# Patient Record
Sex: Male | Born: 1960 | Race: Black or African American | Hispanic: No | Marital: Married | Smoking: Never smoker
Health system: Southern US, Community
[De-identification: ages and names within clinical notes are randomized; demographics above are authoritative.]

## PROBLEM LIST (undated history)

## (undated) DIAGNOSIS — M25562 Pain in left knee: Secondary | ICD-10-CM

## (undated) DIAGNOSIS — M1990 Unspecified osteoarthritis, unspecified site: Secondary | ICD-10-CM

## (undated) DIAGNOSIS — R001 Bradycardia, unspecified: Secondary | ICD-10-CM

## (undated) DIAGNOSIS — M25561 Pain in right knee: Secondary | ICD-10-CM

## (undated) HISTORY — PX: REPLACEMENT TOTAL KNEE: SUR1224

## (undated) HISTORY — PX: KNEE ARTHROSCOPY: SUR90

---

## 1998-10-28 ENCOUNTER — Encounter: Admission: RE | Admit: 1998-10-28 | Discharge: 1998-10-28 | Payer: Self-pay | Admitting: Hematology and Oncology

## 1999-12-28 ENCOUNTER — Emergency Department (HOSPITAL_COMMUNITY): Admission: EM | Admit: 1999-12-28 | Discharge: 1999-12-28 | Payer: Self-pay | Admitting: Emergency Medicine

## 2000-02-17 ENCOUNTER — Emergency Department (HOSPITAL_COMMUNITY): Admission: EM | Admit: 2000-02-17 | Discharge: 2000-02-17 | Payer: Self-pay | Admitting: Emergency Medicine

## 2000-05-08 ENCOUNTER — Emergency Department (HOSPITAL_COMMUNITY): Admission: EM | Admit: 2000-05-08 | Discharge: 2000-05-08 | Payer: Self-pay | Admitting: Emergency Medicine

## 2000-05-08 ENCOUNTER — Encounter: Payer: Self-pay | Admitting: Emergency Medicine

## 2012-11-23 ENCOUNTER — Encounter (HOSPITAL_BASED_OUTPATIENT_CLINIC_OR_DEPARTMENT_OTHER): Payer: Self-pay | Admitting: *Deleted

## 2012-11-23 ENCOUNTER — Emergency Department (HOSPITAL_BASED_OUTPATIENT_CLINIC_OR_DEPARTMENT_OTHER): Payer: BC Managed Care – PPO

## 2012-11-23 ENCOUNTER — Emergency Department (HOSPITAL_BASED_OUTPATIENT_CLINIC_OR_DEPARTMENT_OTHER)
Admission: EM | Admit: 2012-11-23 | Discharge: 2012-11-23 | Disposition: A | Discharge status: Home / Self Care | Payer: BC Managed Care – PPO | Attending: Family Medicine | Admitting: Family Medicine

## 2012-11-23 DIAGNOSIS — J209 Acute bronchitis, unspecified: Secondary | ICD-10-CM | POA: Insufficient documentation

## 2012-11-23 DIAGNOSIS — J40 Bronchitis, not specified as acute or chronic: Secondary | ICD-10-CM

## 2012-11-23 MED ORDER — HYDROCOD POLST-CHLORPHEN POLST 10-8 MG/5ML PO LQCR: 5.0000 mL | Freq: Two times a day (BID) | ORAL | Status: DC

## 2012-11-23 MED ORDER — AZITHROMYCIN 250 MG PO TABS: 250.0000 mg | ORAL_TABLET | Freq: Every day | ORAL | Status: DC

## 2012-11-23 NOTE — ED Provider Notes (Signed)
History     CSN: [id number]  Arrival date & time 11/23/12  1013   First MD Initiated Contact with Patient 11/23/12 1215      Chief Complaint  Patient presents with  . Cough    (Consider location/radiation/quality/duration/timing/severity/associated sxs/prior treatment) Patient is a 52 y.o. male presenting with cough. The history is provided by the patient. No language interpreter was used.  Cough This is a new problem. The current episode started more than 1 week ago. The problem occurs constantly. The problem has been gradually worsening. The cough is productive of sputum. The maximum temperature recorded prior to his arrival was 101 to 101.9 F. Pertinent negatives include no shortness of breath. He has tried nothing for the symptoms. The treatment provided no relief. He is not a smoker.  Pt complains of a cough and congestion  History reviewed. No pertinent past medical history.  History reviewed. No pertinent past surgical history.  No family history on file.  History  Substance Use Topics  . Smoking status: Never Smoker   . Smokeless tobacco: Not on file  . Alcohol Use: No      Review of Systems  Respiratory: Positive for cough. Negative for shortness of breath.   All other systems reviewed and are negative.    Allergies  Review of patient's allergies indicates no known allergies.  Home Medications  No current outpatient prescriptions on file.  BP 131/89  Pulse 67  Temp 98 F (36.7 C) (Oral)  Resp 20  Ht 6\' 1"  (1.854 m)  Wt 236 lb (107.049 kg)  BMI 31.14 kg/m2  SpO2 97%  Physical Exam  Nursing note and vitals reviewed. Constitutional: He is oriented to person, place, and time. He appears well-developed and well-nourished.  HENT:  Head: Normocephalic and atraumatic.  Eyes: Conjunctivae normal are normal. Pupils are equal, round, and reactive to light.  Neck: Normal range of motion. Thyromegaly present.  Cardiovascular: Normal rate and normal heart  sounds.   Pulmonary/Chest: Effort normal and breath sounds normal.  Abdominal: Soft.  Musculoskeletal: Normal range of motion.  Neurological: He is alert and oriented to person, place, and time.  Skin: Skin is warm.    ED Course  Procedures (including critical care time)  Labs Reviewed - No data to display Dg Chest 2 View  11/23/2012  *RADIOLOGY REPORT*  Clinical Data: Cough and congestion.  Fevers and body aches.  CHEST - 2 VIEW  Comparison: None available.  Findings: The heart size is normal.  The lung volumes are slightly low.  No focal airspace disease is evident.  The visualized soft tissues and bony thorax are unremarkable.  IMPRESSION:  1.  Low lung volumes. 2.  No acute cardiopulmonary disease.   Original Report Authenticated By: Marin Roberts, M.D.      No diagnosis found.    MDM  Zithromax and tussionex          Lonia Skinner [place], [place] 11/23/12 1230

## 2012-11-23 NOTE — ED Notes (Signed)
Cough, chest congestion and weakness. States he has been sick for over 2 weeks. He started feeling better then 2 days ago started feeling bad again. Drove himself here. Droplet protection. Wearing a mask at triage.

## 2012-11-27 NOTE — ED Provider Notes (Signed)
Medical screening examination/treatment/procedure(s) were performed by resident physician or non-physician practitioner and as supervising physician I was immediately available for consultation/collaboration.   Barkley Bruns MD.    Linna Hoff, MD 11/27/12 [phone]

## 2014-03-19 IMAGING — CR DG CHEST 2V
2 series · 2 of 2 positions shown · non-contrast
Comparison: None available.

CLINICAL DATA: Cough and congestion.  Fevers and body aches.

CHEST - 2 VIEW

[w chest pa]
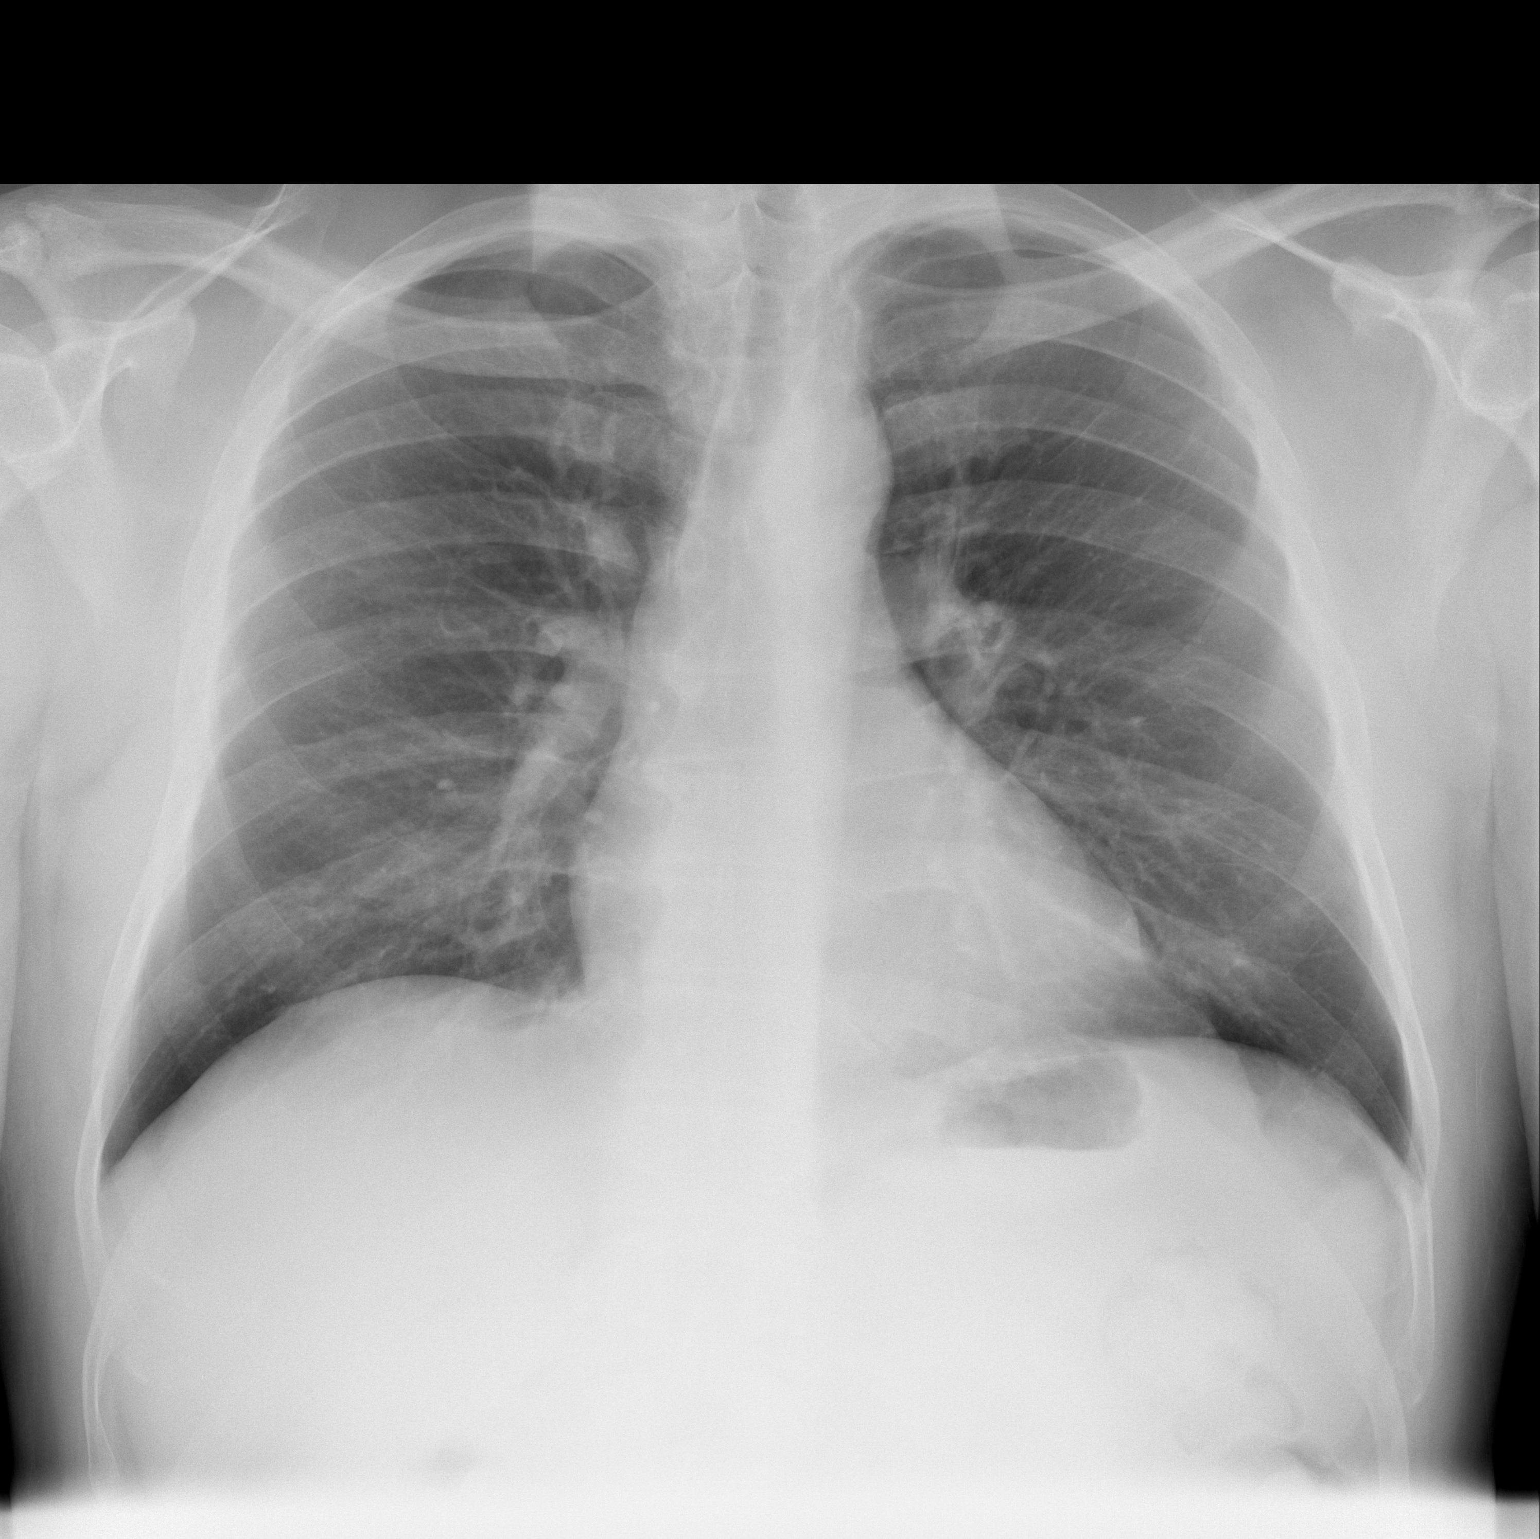

[w chest lat]
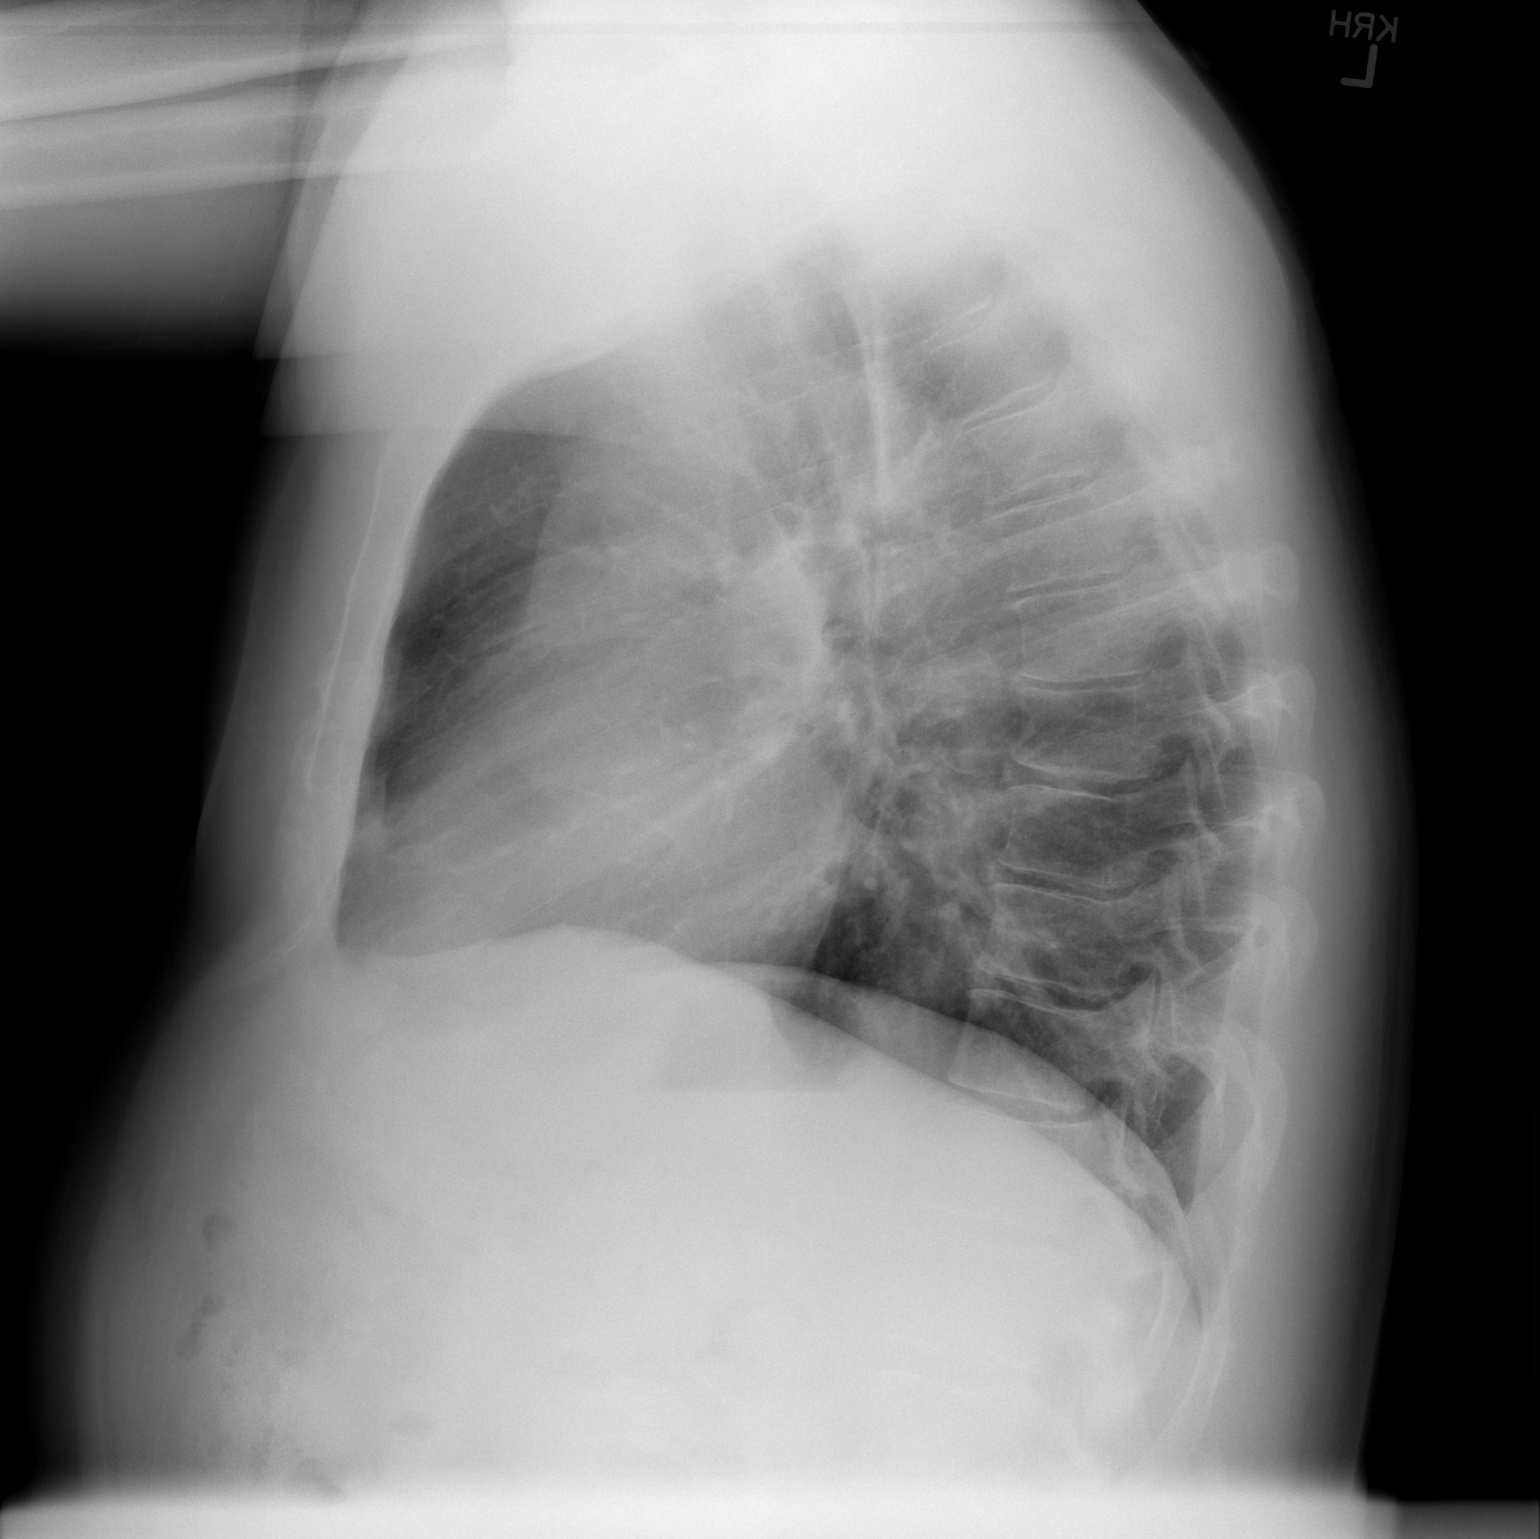

[2 of 2 positions shown; findings below may reference images not displayed]

FINDINGS: The heart size is normal.  The lung volumes are slightly
low.  No focal airspace disease is evident.  The visualized soft
tissues and bony thorax are unremarkable.
IMPRESSION: 1.  Low lung volumes.
2.  No acute cardiopulmonary disease.

## 2015-07-03 ENCOUNTER — Other Ambulatory Visit: Payer: Self-pay | Admitting: Gastroenterology

## 2015-09-24 ENCOUNTER — Encounter (HOSPITAL_COMMUNITY): Payer: Self-pay | Admitting: *Deleted

## 2015-09-30 ENCOUNTER — Encounter (HOSPITAL_COMMUNITY)
Admission: RE | Disposition: A | Discharge status: Home / Self Care | Payer: Self-pay | Source: Ambulatory Visit | Attending: Gastroenterology

## 2015-09-30 ENCOUNTER — Ambulatory Visit (HOSPITAL_COMMUNITY): Payer: BLUE CROSS/BLUE SHIELD | Admitting: Anesthesiology

## 2015-09-30 ENCOUNTER — Encounter (HOSPITAL_COMMUNITY): Payer: Self-pay

## 2015-09-30 ENCOUNTER — Ambulatory Visit (HOSPITAL_COMMUNITY)
Admission: RE | Admit: 2015-09-30 | Discharge: 2015-09-30 | Disposition: A | Discharge status: Home / Self Care | Payer: BLUE CROSS/BLUE SHIELD | Source: Ambulatory Visit | Attending: Gastroenterology | Admitting: Gastroenterology

## 2015-09-30 DIAGNOSIS — D120 Benign neoplasm of cecum: Secondary | ICD-10-CM | POA: Diagnosis not present

## 2015-09-30 DIAGNOSIS — Z1211 Encounter for screening for malignant neoplasm of colon: Secondary | ICD-10-CM | POA: Insufficient documentation

## 2015-09-30 DIAGNOSIS — Z6831 Body mass index (BMI) 31.0-31.9, adult: Secondary | ICD-10-CM | POA: Insufficient documentation

## 2015-09-30 DIAGNOSIS — E669 Obesity, unspecified: Secondary | ICD-10-CM | POA: Diagnosis not present

## 2015-09-30 HISTORY — DX: Pain in right knee: M25.561

## 2015-09-30 HISTORY — PX: COLONOSCOPY WITH PROPOFOL: SHX5780

## 2015-09-30 HISTORY — DX: Pain in left knee: M25.562

## 2015-09-30 SURGERY — COLONOSCOPY WITH PROPOFOL
Anesthesia: Monitor Anesthesia Care

## 2015-09-30 MED ORDER — PROPOFOL 500 MG/50ML IV EMUL
INTRAVENOUS | Status: DC | PRN
  Administered 2015-09-30: 100 ug/kg/min via INTRAVENOUS

## 2015-09-30 MED ORDER — PROPOFOL 10 MG/ML IV BOLUS
INTRAVENOUS | Status: AC
  Filled 2015-09-30: qty 40

## 2015-09-30 MED ORDER — SODIUM CHLORIDE 0.9 % IV SOLN: INTRAVENOUS | Status: DC

## 2015-09-30 MED ORDER — LIDOCAINE HCL (CARDIAC) 20 MG/ML IV SOLN
INTRAVENOUS | Status: DC | PRN
  Administered 2015-09-30: 50 mg via INTRAVENOUS

## 2015-09-30 MED ORDER — LACTATED RINGERS IV SOLN
INTRAVENOUS | Status: DC
  Administered 2015-09-30: 09:00:00 via INTRAVENOUS

## 2015-09-30 MED ORDER — PROPOFOL 500 MG/50ML IV EMUL
INTRAVENOUS | Status: DC | PRN
  Administered 2015-09-30 (×2): 30 mg via INTRAVENOUS

## 2015-09-30 SURGICAL SUPPLY — 21 items

## 2015-09-30 NOTE — H&P (Signed)
  Procedure: Baseline screening colonoscopy. No family history of colon cancer  History: The patient is a 54 year old male born 1961-09-23. He is scheduled to undergo his first screening colonoscopy with polypectomy to prevent colon cancer.  Past medical history: No chronic medical problems  Exam: The patient is alert and lying comfortably on the endoscopy stretcher. Abdomen is soft and nontender to palpation. Lungs are clear to auscultation. Cardiac exam reveals a regular rhythm.  Plan: Proceed with baseline screening colonoscopy.

## 2015-09-30 NOTE — Transfer of Care (Signed)
Immediate Anesthesia Transfer of Care Note  Patient: Javier Lawson  Procedure(s) Performed: Procedure(s): COLONOSCOPY WITH PROPOFOL (N/A)  Patient Location: PACU  Anesthesia Type:MAC  Level of Consciousness: sedated, patient cooperative and responds to stimulation  Airway & Oxygen Therapy: Patient Spontanous Breathing and Patient connected to face mask oxygen  Post-op Assessment: Report given to RN and Post -op Vital signs reviewed and stable  Post vital signs: Reviewed and stable  Last Vitals:  Filed Vitals:   09/30/15 0800 09/30/15 0912  BP: 132/71 112/68  Pulse: 9 64  Temp: 36.8 C   Resp: 16 12    Complications: No apparent anesthesia complications

## 2015-09-30 NOTE — Op Note (Signed)
Procedure: Baseline screening colonoscopy. No family history of colon cancer.  Endoscopist: Earle Gell  Premedication: Propofol administered by anesthesia  Procedure: The patient was placed in the left lateral decubitus position. Anal inspection and digital rectal exam were normal. The Pentax pediatric colonoscope was introduced into the rectum and advanced to the cecum. A normal-appearing appendiceal orifice and ileocecal valve were identified. Colonic preparation for the exam today was good. Withdrawal time was 11 minutes  Rectum. Normal. Retroflexed view of the distal rectum was normal  Sigmoid colon and descending colon. Normal  Splenic flexure. Normal  Transverse colon. Normal  Hepatic flexure. Normal  Ascending colon. Normal  Cecum and ileocecal valve. A 3 mm sessile polyp was removed from the mid cecum with the cold biopsy forceps  Assessment: A diminutive polyp was removed from the cecum. Otherwise normal colonoscopy.  Recommendation: If the polyp returns adenomatous pathologically, schedule repeat colonoscopy in 5 years

## 2015-09-30 NOTE — Anesthesia Preprocedure Evaluation (Signed)
Anesthesia Evaluation  Patient identified by MRN, date of birth, ID band Patient awake    Reviewed: Allergy & Precautions, NPO status , Patient's Chart, lab work & pertinent test results  Airway Mallampati: I  TM Distance: >3 FB Neck ROM: Full    Dental  (+) Teeth Intact, Dental Advisory Given   Pulmonary neg pulmonary ROS,    Pulmonary exam normal breath sounds clear to auscultation       Cardiovascular Exercise Tolerance: Good (-) hypertension(-) angina(-) CAD and (-) Past MI negative cardio ROS Normal cardiovascular exam Rhythm:Regular Rate:Normal     Neuro/Psych negative neurological ROS  negative psych ROS   GI/Hepatic negative GI ROS, Neg liver ROS,   Endo/Other  Obesity   Renal/GU negative Renal ROS  negative genitourinary   Musculoskeletal negative musculoskeletal ROS (+)   Abdominal   Peds  Hematology negative hematology ROS (+)   Anesthesia Other Findings Day of surgery medications reviewed with the patient.  Reproductive/Obstetrics                             Anesthesia Physical Anesthesia Plan  ASA: II  Anesthesia Plan: MAC   Post-op Pain Management:    Induction: Intravenous  Airway Management Planned: Nasal Cannula  Additional Equipment:   Intra-op Plan:   Post-operative Plan:   Informed Consent: I have reviewed the patients History and Physical, chart, labs and discussed the procedure including the risks, benefits and alternatives for the proposed anesthesia with the patient or authorized representative who has indicated his/her understanding and acceptance.   Dental advisory given  Plan Discussed with: CRNA and Anesthesiologist  Anesthesia Plan Comments: (Discussed risks/benefits/alternatives to MAC sedation including need for ventilatory support, hypotension, need for conversion to general anesthesia.  All patient questions answered.  Patient wished to  proceed.)        Anesthesia Quick Evaluation

## 2015-09-30 NOTE — Anesthesia Postprocedure Evaluation (Signed)
Anesthesia Post Note  Patient: Javier Lawson  Procedure(s) Performed: Procedure(s) (LRB): COLONOSCOPY WITH PROPOFOL (N/A)  Patient location during evaluation: PACU Anesthesia Type: MAC Level of consciousness: awake and alert Pain management: pain level controlled Vital Signs Assessment: post-procedure vital signs reviewed and stable Respiratory status: spontaneous breathing, nonlabored ventilation, respiratory function stable and patient connected to nasal cannula oxygen Cardiovascular status: stable and blood pressure returned to baseline Anesthetic complications: no    Last Vitals:  Filed Vitals:   09/30/15 0920 09/30/15 0930  BP: 112/74 111/66  Pulse: 51 42  Temp:    Resp: 19 14    Last Pain: There were no vitals filed for this visit.               Catalina Gravel

## 2015-09-30 NOTE — Discharge Instructions (Signed)

## 2015-10-01 ENCOUNTER — Encounter (HOSPITAL_COMMUNITY): Payer: Self-pay | Admitting: Gastroenterology

## 2018-07-05 ENCOUNTER — Encounter (INDEPENDENT_AMBULATORY_CARE_PROVIDER_SITE_OTHER): Payer: Self-pay | Admitting: Orthopaedic Surgery

## 2018-07-05 ENCOUNTER — Telehealth (INDEPENDENT_AMBULATORY_CARE_PROVIDER_SITE_OTHER): Payer: Self-pay

## 2018-07-05 ENCOUNTER — Ambulatory Visit (INDEPENDENT_AMBULATORY_CARE_PROVIDER_SITE_OTHER): Payer: No Typology Code available for payment source | Admitting: Orthopaedic Surgery

## 2018-07-05 VITALS — BP 124/79 | HR 56 | Ht 73.0 in | Wt 238.0 lb

## 2018-07-05 DIAGNOSIS — M5126 Other intervertebral disc displacement, lumbar region: Secondary | ICD-10-CM

## 2018-07-05 NOTE — Progress Notes (Signed)
Office Visit Note for IME   Patient: Javier Lawson           Date of Birth: 01-08-1961           MRN: [id number] Visit Date: 07/05/2018              Requested by: No referring provider defined for this encounter. PCP: Cathie Olden, MD   Assessment & Plan: Visit Diagnoses:  1. Protrusion of lumbar intervertebral disc           L5-S1 with last MRI improved.   Plan: Patient's had appropriate care and treatment.  We reviewed MRI images from his February MRI which she had not seen.  Earlier 01/07/2017 MRI was also reviewed.  Patient has absent ankle jerk on the left and some mild nerve root tension signs but no isolated motor weakness and no significant nerve compression by MRI scan.  He is doing regular work with some symptoms.  He has had appropriate rating.  If he had some significant increase in symptoms at some point single epidural injection would be an option.  Patient has questions about disc degeneration and pathophysiology which we discussed.  No further imaging or treatment is recommended at this point.  Follow-Up Instructions: Return if symptoms worsen or fail to improve.   Orders:  No orders of the defined types were placed in this encounter.  No orders of the defined types were placed in this encounter.     Procedures: No procedures performed   Clinical Data: No additional findings.   Subjective: Chief Complaint  Patient presents with  . Lower Back - Pain    IME    HPI 57 year old male seen for an IME concerning 2 separate on-the-job injuries.  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  He was injured in February 2018 moving rolls of cough another employee was helping him pick it up he turned and twisted had sharp pain in his back and into his left buttocks.  He was treated conservatively by Dr. Gladstone Lighter with medication, outpatient physical therapy.  MRI showed disc herniation L5-S1 on the left and surgery  was discussed at that point.  Patient took a Medrol Dosepak and got some improvement after the Dosepak and surgery was not performed.  July was rated 5% return to full duty.  He reinjured his back 10/22/2017 and states he was driving a forklift for plate broke in the forklift when in the hole more than the foot deep.  He had recurrence of back pain left leg pain.  He denies any right leg symptoms.  He had no bowel or bladder symptoms.  A follow-up MRI was obtained which showed resolution of the disc herniation at L5-S1.  He was on modified duty for period of time and eventually resume normal activity and regular work without restrictions.  He was rated at 10%.  Patient still has had some intermittent pain in his leg some difficulty with forward flexion as he bends down to touch his toes.  He states his pain is around 3 out of 10 and is variable with some days worse than others.  Patient requested IME since he was not symptomatic before his original injuries and had not had complete resolution of his symptoms.  MRI showed some mild narrowing of left lateral recess and both foramen at L5-S1 and some facet arthropathy slightly worse at L4 -5 than L5-S1.  Patient's had muscle relaxant prednisone pack lumbar brace, outpatient physical  therapy.  No epidural injections.  Accompanying patient is to MRI scans in approximately 40 pages of notes including urgent care, first and second injury MRI, scans, physical therapy, 25R   Review of Systems patient's a non-smoker.  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  14 point review of systems negative as it pertains HPI.   Objective: Vital Signs: BP 124/79   Pulse (!) 56   Ht 6\' 1"  (1.854 m)   Wt 238 lb (108 kg)   BMI 31.40 kg/m   Physical Exam  Constitutional: He is oriented to person, place, and time. He appears well-developed and well-nourished.  HENT:  Head:  Normocephalic and atraumatic.  Eyes: Pupils are equal, round, and reactive to light. EOM are normal.  Neck: No tracheal deviation present. No thyromegaly present.  Cardiovascular: Normal rate.  Pulmonary/Chest: Effort normal. He has no wheezes.  Abdominal: Soft. Bowel sounds are normal.  Neurological: He is alert and oriented to person, place, and time.  Skin: Skin is warm and dry. Capillary refill takes less than 2 seconds.  Psychiatric: He has a normal mood and affect. His behavior is normal. Judgment and thought content normal.    Ortho Exam patient gets from sitting to standing on off the exam table easily.  He can heel and toe walk.  Has some pain with straight leg raising on the left at 90 degrees some mild pain with sciatic notch palpation and palpation of the left SI joint none on the right.  Anterior tib EHL gastrocsoleus is normal.  Has 2+ knee jerk 2+ right ankle jerk and absent left ankle jerk.  Specialty Comments:  No specialty comments available.  Imaging: No results found.   PMFS History: There are no active problems to display for this patient.  Past Medical History:  Diagnosis Date  . Pain in both knees    c/o "aching"- no major problem    No family history on file.  Past Surgical History:  Procedure Laterality Date  . COLONOSCOPY WITH PROPOFOL N/A 09/30/2015   Procedure: COLONOSCOPY WITH PROPOFOL;  Surgeon: Garlan Fair, MD;  Location: [facility];  Service: Endoscopy;  Laterality: N/A;   Social History   Occupational History  . Not on file  Tobacco Use  . Smoking status: Never Smoker  . Smokeless tobacco: Never Used  Substance and Sexual Activity  . Alcohol use: No  . Drug use: No  . Sexual activity: Not on file

## 2018-07-05 NOTE — Telephone Encounter (Signed)
-----   Message from Marybelle Killings, MD sent at 07/05/2018  9:38 AM EDT ----- Cc to W/C

## 2018-07-05 NOTE — Telephone Encounter (Signed)
Faxed office note to wc adj [place] Hard [phone]

## 2020-01-25 ENCOUNTER — Telehealth: Payer: Self-pay | Admitting: *Deleted

## 2020-01-25 NOTE — Telephone Encounter (Signed)
Guinevere Ferrari, CMA had brought records for referral to cardiology to me for Mr. Crute for pre op clearance and Bradycardia. I reviewed the notes from [facility] and found pt is having Right Knee Replacement. I s/w [facility] to see if they knew who was doing the surgery. I was advised surgery being done by [facility]. I called [facility] [phone] and asked if clearance request could please be faxed to our office. I advised that Dr. Theda Sers will be the surgeon. [facility] will fax over clearance [phone].   Guinevere Ferrari, CMA did ask for me to return the records back to chart prep after I have placed clearance note in.

## 2020-01-28 NOTE — Telephone Encounter (Signed)
I will send a message to the scheduling team to schedule pt a New Pt appt as he is being referred to our office per PCP in regards to bradycardia seen on EKG done by PCP. Records are on file with Guinevere Ferrari, CMA in Chart Prep.

## 2020-01-28 NOTE — Telephone Encounter (Signed)
   [facility] Pre-operative Risk Assessment    Request for surgical clearance:  1. What type of surgery is being performed? RIGHT TOTAL KNEE ARTHROPLASTY   2. When is this surgery scheduled? TBD   3. What type of clearance is required (medical clearance vs. Pharmacy clearance to hold med vs. Both)? MEDICAL  4. Are there any medications that need to be held prior to surgery and how long? NONE LISTED   5. Practice name and name of physician performing surgery? [facility]; DR. Mitzi Hansen [place]   6. What is your office phone number [phone]    7.   What is your office fax number [phone] ATTN: ASHLEY HILTON  8.   Anesthesia type (None, local, MAC, general) ? SPINAL   Javier Lawson 01/28/2020, 2:16 PM  _________________________________________________________________   (provider comments below)

## 2020-01-28 NOTE — Telephone Encounter (Signed)
   Primary Cardiologist:No primary care provider on file.  Chart reviewed as part of pre-operative protocol coverage. Patient has never been seen by our office. It looks like he was seen by PCP on 01/24/2020 for pre-op evaluation and there was concern for conduction delay on EKG. Patient will need to be seen for a New Patient visit prior to surgery.   Pre-op covering staff: - Please schedule appointment and call patient to inform them. - Please contact requesting surgeon's office via preferred method (i.e, phone, fax) to inform them of need for appointment prior to surgery.  If applicable, this message will also be routed to pharmacy pool and/or primary cardiologist for input on holding anticoagulant/antiplatelet agent as requested below so that this information is available at time of patient's appointment.   Darreld Mclean, PA-C  01/28/2020, 2:30 PM

## 2020-01-29 NOTE — Telephone Encounter (Signed)
Pt has been scheduled to see Dr. Marlou Porch 01/31/20 for New Pt referral dx bradycardia as well as pre op clearance. I will forward clearance notes to Dr. Marlou Porch for upcoming appt. I will send FYI to Dr. Hart Robinsons pt has New Pt appt 01/31/20. I will remove from the pre op call back pool.

## 2020-01-31 ENCOUNTER — Encounter: Payer: Self-pay | Admitting: Cardiology

## 2020-01-31 ENCOUNTER — Ambulatory Visit (INDEPENDENT_AMBULATORY_CARE_PROVIDER_SITE_OTHER): Payer: BC Managed Care – PPO | Admitting: Cardiology

## 2020-01-31 ENCOUNTER — Other Ambulatory Visit: Payer: Self-pay

## 2020-01-31 VITALS — BP 122/80 | HR 62 | Resp 15 | Ht 73.0 in | Wt 247.4 lb

## 2020-01-31 DIAGNOSIS — R001 Bradycardia, unspecified: Secondary | ICD-10-CM

## 2020-01-31 DIAGNOSIS — R9431 Abnormal electrocardiogram [ECG] [EKG]: Secondary | ICD-10-CM | POA: Diagnosis not present

## 2020-01-31 DIAGNOSIS — Z01810 Encounter for preprocedural cardiovascular examination: Secondary | ICD-10-CM

## 2020-01-31 NOTE — Progress Notes (Signed)
Cardiology Office Note:    Date:  01/31/2020   ID:  Javier Lawson, DOB 1961-05-26, MRN [id number]  PCP:  Cathie Olden, MD  Cardiologist:  No primary care provider on file.  Electrophysiologist:  None   Referring MD: Yong Channel*     History of Present Illness:    Javier Lawson is a 59 y.o. male ration of bradycardia, preoperative assessment at the request of Dr. Lorne Skeens.  Planning to have a right total knee replacement.  No prior cardiac history.  Mother had heart disease-pacemaker, father had heart disease,--CHF on father side.   Brother has CAD as well.  Blood pressure prior visit was 119/66 with pulse of 50.  He was asymptomatic.  EKG questionable conduction delay.    Past Medical History:  Diagnosis Date  . Pain in both knees    c/o "aching"- no major problem    Past Surgical History:  Procedure Laterality Date  . COLONOSCOPY WITH PROPOFOL N/A 09/30/2015   Procedure: COLONOSCOPY WITH PROPOFOL;  Surgeon: Garlan Fair, MD;  Location: [facility];  Service: Endoscopy;  Laterality: N/A;    Current Medications: Current Meds  Medication Sig  . Ascorbic Acid (VITAMIN C) 1000 MG tablet Take 1,000 mg by mouth every other day.   . diclofenac Sodium (VOLTAREN) 1 % GEL Apply 4 g topically in the morning, at noon, in the evening, and at bedtime.  Marland Kitchen ibuprofen (ADVIL,MOTRIN) 800 MG tablet ibuprofen 800 mg tablet  TAKE 1 TABLET BY MOUTH EVERY 6 HOURS  . traMADol (ULTRAM) 50 MG tablet as needed.     Allergies:   Patient has no known allergies.   Social History   Socioeconomic History  . Marital status: Married    Spouse name: Not on file  . Number of children: Not on file  . Years of education: Not on file  . Highest education level: Not on file  Occupational History  . Not on file  Tobacco Use  . Smoking status: Never Smoker  . Smokeless tobacco: Never Used  Substance and Sexual Activity  . Alcohol use: No  . Drug use: No   . Sexual activity: Not on file  Other Topics Concern  . Not on file  Social History Narrative  . Not on file   Social Determinants of Health   Financial Resource Strain:   . Difficulty of Paying Living Expenses:   Food Insecurity:   . Worried About Charity fundraiser in the Last Year:   . Arboriculturist in the Last Year:   Transportation Needs:   . Film/video editor (Medical):   Marland Kitchen Lack of Transportation (Non-Medical):   Physical Activity:   . Days of Exercise per Week:   . Minutes of Exercise per Session:   Stress:   . Feeling of Stress :   Social Connections:   . Frequency of Communication with Friends and Family:   . Frequency of Social Gatherings with Friends and Family:   . Attends Religious Services:   . Active Member of Clubs or Organizations:   . Attends Archivist Meetings:   Marland Kitchen Marital Status:      Family History: Mother with pacemaker.  On father side CAD  ROS:   Please see the history of present illness.    No fevers chills nausea vomiting syncope bleeding all other systems reviewed and are negative.  EKGs/Labs/Other Studies Reviewed:    The following studies were reviewed today: Outside office notes reviewed.  EKG:  EKG is  ordered today.  The ekg ordered today demonstrates sinus rhythm 62, nonspecific interventricular conduction delay.  Recent Labs: No results found for requested labs within last 8760 hours.  Recent Lipid Panel No results found for: CHOL, TRIG, HDL, CHOLHDL, VLDL, LDLCALC, LDLDIRECT  Physical Exam:    VS:  BP 122/80   Pulse 62   Resp 15   Ht 6\' 1"  (1.854 m)   Wt 247 lb 6.4 oz (112.2 kg)   SpO2 97%   BMI 32.64 kg/m     Wt Readings from Last 3 Encounters:  01/31/20 247 lb 6.4 oz (112.2 kg)  07/05/18 238 lb (108 kg)  09/30/15 236 lb (107 kg)     GEN:  Well nourished, well developed in no acute distress HEENT: Normal NECK: No JVD; No carotid bruits LYMPHATICS: No lymphadenopathy CARDIAC: RRR, no murmurs,  rubs, gallops RESPIRATORY:  Clear to auscultation without rales, wheezing or rhonchi  ABDOMEN: Soft, non-tender, non-distended MUSCULOSKELETAL:  No edema; No deformity  SKIN: Warm and dry NEUROLOGIC:  Alert and oriented x 3 PSYCHIATRIC:  Normal affect   ASSESSMENT:    1. Preoperative cardiovascular examination   2. Bradycardia   3. Abnormal EKG    PLAN:    In order of problems listed above:  Preoperative cardiovascular exam-right knee osteoarthritis awaiting total knee replacement, Dr. Theda Sers -He may proceed with knee surgery with low overall cardiac risk.  No prior MI no syncope no heart failure, no adverse arrhythmias. -Bradycardia asymptomatic.  Should not be of any clinical significance. -Try to avoid AV nodal blocking agents such as beta-blockers or calcium channel blockers.  Bradycardia/interventricular conduction delay on ECG/abnormal ECG -Asymptomatic.  Today has EKG consistent with interventricular conduction delay.  No history of syncope. -I will check an echocardiogram to ensure proper structure and function of his heart.  This should not delay his surgery.  We will see him back in about 2 years  Medication Adjustments/Labs and Tests Ordered: Current medicines are reviewed at length with the patient today.  Concerns regarding medicines are outlined above.  Orders Placed This Encounter  Procedures  . EKG 12-Lead  . ECHOCARDIOGRAM COMPLETE   No orders of the defined types were placed in this encounter.   Patient Instructions  Medication Instructions:  The current medical regimen is effective;  continue present plan and medications.  *If you need a refill on your cardiac medications before your next appointment, please call your pharmacy*  Testing/Procedures: Your physician has requested that you have an echocardiogram. Echocardiography is a painless test that uses sound waves to create images of your heart. It provides your doctor with information about the  size and shape of your heart and how well your heart's chambers and valves are working. This procedure takes approximately one hour. There are no restrictions for this procedure.  Follow-Up: At [facility], you and your health needs are our priority.  As part of our continuing mission to provide you with exceptional heart care, we have created designated Provider Care Teams.  These Care Teams include your primary Cardiologist (physician) and Advanced Practice Providers (APPs -  Physician Assistants and Nurse Practitioners) who all work together to provide you with the care you need, when you need it.  We recommend signing up for the patient portal called "MyChart".  Sign up information is provided on this After Visit Summary.  MyChart is used to connect with patients for Virtual Visits (Telemedicine).  Patients are able to view lab/test results,  encounter notes, upcoming appointments, etc.  Non-urgent messages can be sent to your provider as well.   To learn more about what you can do with MyChart, go to [email or web address].    Your next appointment:   2 year(s)  The format for your next appointment:   In Person  Provider:   Candee Furbish, MD   Thank you for choosing [facility]!!        Signed, Candee Furbish, MD  01/31/2020 5:24 PM    [place]

## 2020-01-31 NOTE — Patient Instructions (Signed)
Medication Instructions:  The current medical regimen is effective;  continue present plan and medications.  *If you need a refill on your cardiac medications before your next appointment, please call your pharmacy*  Testing/Procedures: Your physician has requested that you have an echocardiogram. Echocardiography is a painless test that uses sound waves to create images of your heart. It provides your doctor with information about the size and shape of your heart and how well your heart's chambers and valves are working. This procedure takes approximately one hour. There are no restrictions for this procedure.  Follow-Up: At [facility], you and your health needs are our priority.  As part of our continuing mission to provide you with exceptional heart care, we have created designated Provider Care Teams.  These Care Teams include your primary Cardiologist (physician) and Advanced Practice Providers (APPs -  Physician Assistants and Nurse Practitioners) who all work together to provide you with the care you need, when you need it.  We recommend signing up for the patient portal called "MyChart".  Sign up information is provided on this After Visit Summary.  MyChart is used to connect with patients for Virtual Visits (Telemedicine).  Patients are able to view lab/test results, encounter notes, upcoming appointments, etc.  Non-urgent messages can be sent to your provider as well.   To learn more about what you can do with MyChart, go to [email or web address].    Your next appointment:   2 year(s)  The format for your next appointment:   In Person  Provider:   Terrence Skains, MD   Thank you for choosing [facility]!!     

## 2020-02-18 ENCOUNTER — Ambulatory Visit (HOSPITAL_COMMUNITY): Payer: BC Managed Care – PPO | Attending: Internal Medicine

## 2020-02-18 ENCOUNTER — Other Ambulatory Visit: Payer: Self-pay

## 2020-02-18 DIAGNOSIS — R9431 Abnormal electrocardiogram [ECG] [EKG]: Secondary | ICD-10-CM | POA: Diagnosis not present

## 2020-02-18 DIAGNOSIS — R001 Bradycardia, unspecified: Secondary | ICD-10-CM | POA: Insufficient documentation

## 2020-02-19 ENCOUNTER — Telehealth: Payer: Self-pay

## 2020-02-19 DIAGNOSIS — I77810 Thoracic aortic ectasia: Secondary | ICD-10-CM

## 2020-02-19 NOTE — Telephone Encounter (Signed)
The patient has been notified of the Echo result and verbalized understanding.  All questions (if any) were answered. Frederik Schmidt, RN 02/19/2020 1:45 PM

## 2020-02-19 NOTE — Telephone Encounter (Signed)
-----   Message from Jerline Pain, MD sent at 02/19/2020  7:00 AM EDT ----- Pump function is normal 50% Aortic root is 43 mm, mildly dilated OK to proceed with ortho surgery  Let's repeat ECHO in one year to ensure no change in aorta Candee Furbish, MD

## 2020-04-30 ENCOUNTER — Ambulatory Visit (HOSPITAL_COMMUNITY)
Admission: RE | Admit: 2020-04-30 | Discharge: 2020-04-30 | Disposition: A | Discharge status: Home / Self Care | Payer: BC Managed Care – PPO | Source: Ambulatory Visit | Attending: Cardiology | Admitting: Cardiology

## 2020-04-30 ENCOUNTER — Other Ambulatory Visit (HOSPITAL_COMMUNITY): Payer: Self-pay | Admitting: Specialist

## 2020-04-30 ENCOUNTER — Other Ambulatory Visit: Payer: Self-pay

## 2020-04-30 DIAGNOSIS — M79661 Pain in right lower leg: Secondary | ICD-10-CM | POA: Insufficient documentation

## 2020-04-30 DIAGNOSIS — M79605 Pain in left leg: Secondary | ICD-10-CM | POA: Diagnosis present

## 2020-04-30 DIAGNOSIS — M79604 Pain in right leg: Secondary | ICD-10-CM | POA: Insufficient documentation

## 2021-02-10 ENCOUNTER — Telehealth (HOSPITAL_COMMUNITY): Payer: Self-pay | Admitting: Cardiology

## 2021-02-10 NOTE — Telephone Encounter (Signed)
02/10/21  patient declined to schedule Echocardiogram  at this time and states he is going for a physical and will call us back after he has that. LBW 9:53am Order will be removed from the [place] and when he calls back to schedule we will reinstate the order.

## 2022-08-12 ENCOUNTER — Other Ambulatory Visit (HOSPITAL_COMMUNITY): Payer: Self-pay | Admitting: Orthopedic Surgery

## 2022-08-12 ENCOUNTER — Other Ambulatory Visit: Payer: Self-pay | Admitting: Orthopedic Surgery

## 2022-08-12 DIAGNOSIS — T84032D Mechanical loosening of internal right knee prosthetic joint, subsequent encounter: Secondary | ICD-10-CM

## 2022-08-24 ENCOUNTER — Encounter (HOSPITAL_COMMUNITY)
Admission: RE | Admit: 2022-08-24 | Discharge: 2022-08-24 | Disposition: A | Discharge status: Home / Self Care | Payer: No Typology Code available for payment source | Source: Ambulatory Visit | Attending: Orthopedic Surgery | Admitting: Orthopedic Surgery

## 2022-08-24 DIAGNOSIS — T84032D Mechanical loosening of internal right knee prosthetic joint, subsequent encounter: Secondary | ICD-10-CM | POA: Diagnosis present

## 2022-08-24 MED ORDER — TECHNETIUM TC 99M MEDRONATE IV KIT
20.0000 | PACK | Freq: Once | INTRAVENOUS | Status: AC | PRN
  Administered 2022-08-24: 20 via INTRAVENOUS

## 2023-02-25 ENCOUNTER — Other Ambulatory Visit (HOSPITAL_COMMUNITY): Payer: Self-pay | Admitting: Specialist

## 2023-02-25 DIAGNOSIS — Z96651 Presence of right artificial knee joint: Secondary | ICD-10-CM

## 2023-03-01 ENCOUNTER — Encounter (HOSPITAL_COMMUNITY)
Admission: RE | Admit: 2023-03-01 | Discharge: 2023-03-01 | Disposition: A | Discharge status: Home / Self Care | Payer: No Typology Code available for payment source | Source: Ambulatory Visit | Attending: Specialist | Admitting: Specialist

## 2023-03-01 DIAGNOSIS — Z96651 Presence of right artificial knee joint: Secondary | ICD-10-CM | POA: Diagnosis not present

## 2023-03-01 MED ORDER — TECHNETIUM TC 99M MEDRONATE IV KIT
22.0000 | PACK | Freq: Once | INTRAVENOUS | Status: AC | PRN
  Administered 2023-03-01: 22 via INTRAVENOUS

## 2024-01-02 ENCOUNTER — Ambulatory Visit: Payer: Self-pay | Admitting: Student

## 2024-01-02 NOTE — H&P (Signed)
 TOTAL KNEE REVISION ADMISSION H&P  Patient is being admitted for right revision total knee arthroplasty.  Subjective:  Chief Complaint:right knee pain.  HPI: Javier Lawson, 63 y.o. male, has a history of pain and functional disability in the right knee(s) due to failed previous arthroplasty and patient has failed non-surgical conservative treatments for greater than 12 weeks to include NSAID's and/or analgesics, flexibility and strengthening excercises, supervised PT with diminished ADL's post treatment, and activity modification. The indications for the revision of the total knee arthroplasty are  flexion instability . Onset of symptoms was gradual starting 3 years ago with rapidlly worsening course since that time.  Prior procedures on the right knee(s) include arthroplasty.  Patient currently rates pain in the right knee(s) at 10 out of 10 with activity. There is night pain, worsening of pain with activity and weight bearing, pain that interferes with activities of daily living, pain with passive range of motion, crepitus, and joint swelling.  Patient has evidence of  internal rotation of the components, negative bone scan  by imaging studies. This condition presents safety issues increasing the risk of falls.  There is no current active infection.  There are no active problems to display for this patient.  Past Medical History:  Diagnosis Date   Pain in both knees    c/o "aching"- no major problem    Past Surgical History:  Procedure Laterality Date   COLONOSCOPY WITH PROPOFOL N/A 09/30/2015   Procedure: COLONOSCOPY WITH PROPOFOL;  Surgeon: Charolett Bumpers, MD;  Location: [facility];  Service: Endoscopy;  Laterality: N/A;    Current Outpatient Medications  Medication Sig Dispense Refill Last Dose/Taking   acetaminophen (TYLENOL) 500 MG tablet Take 500 mg by mouth every 6 (six) hours as needed for moderate pain (pain score 4-6).      Ascorbic Acid (VITAMIN C) 1000 MG tablet Take 1,000  mg by mouth daily.      diphenhydramine-acetaminophen (TYLENOL PM) 25-500 MG TABS tablet Take 1 tablet by mouth at bedtime as needed (sleep).      No current facility-administered medications for this visit.   No Known Allergies  Social History   Tobacco Use   Smoking status: Never   Smokeless tobacco: Never  Substance Use Topics   Alcohol use: No    No family history on file.    Review of Systems  Musculoskeletal:  Positive for arthralgias, gait problem and joint swelling.  All other systems reviewed and are negative.    Objective:  Physical Exam Constitutional:      Appearance: Normal appearance.  HENT:     Head: Normocephalic and atraumatic.     Nose: Nose normal.     Mouth/Throat:     Mouth: Mucous membranes are moist.     Pharynx: Oropharynx is clear.  Eyes:     Conjunctiva/sclera: Conjunctivae normal.  Cardiovascular:     Rate and Rhythm: Normal rate and regular rhythm.     Pulses: Normal pulses.     Heart sounds: Normal heart sounds.  Pulmonary:     Effort: Pulmonary effort is normal.     Breath sounds: Normal breath sounds.  Abdominal:     General: Abdomen is flat.     Palpations: Abdomen is soft.  Genitourinary:    Comments: Deferred.  Musculoskeletal:     Cervical back: Normal range of motion and neck supple.     Comments: Examination of the right knee reveals a healed anterior incision. Mild swelling. No warmth, erythema, or  effusion. He has some tenderness to palpation over the tibial component. No tenderness to palpation over the femoral component. There is no significant peripatellar retinacular tenderness to palpation. His range of motion is 6 to 125 degrees. There is no varus/valgus instability in full extension. In mid flexion and full flexion, he does have instability. No extensor lag. Painless range of motion the hip  Distally, there is no focal motor or sensory deficit. He has palpable pedal pulses  Skin:    General: Skin is warm and dry.      Capillary Refill: Capillary refill takes less than 2 seconds.  Neurological:     General: No focal deficit present.     Mental Status: He is alert and oriented to person, place, and time.  Psychiatric:        Mood and Affect: Mood normal.        Behavior: Behavior normal.        Thought Content: Thought content normal.        Judgment: Judgment normal.     Vital signs in last 24 hours: @VSRANGES @  Labs:  Estimated body mass index is 32.64 kg/m as calculated from the following:   Height as of 01/31/20: 6\' 1"  (1.854 m).   Weight as of 01/31/20: 112.2 kg.  Imaging Review Plain radiographs demonstrate severe degenerative joint disease of the right knee(s). The overall alignment is neutral.There is evidence no of loosening of the femoral and tibial components. The bone quality appears to be adequate for age and reported activity level. There is internal rotation of the components.    Assessment/Plan:  End stage arthritis, right knee(s) with failed previous arthroplasty.   The patient history, physical examination, clinical judgment of the provider and imaging studies are consistent with end stage degenerative joint disease of the right knee(s), previous total knee arthroplasty. Revision total knee arthroplasty is deemed medically necessary. The treatment options including medical management, injection therapy, arthroscopy and revision arthroplasty were discussed at length. The risks and benefits of revision total knee arthroplasty were presented and reviewed. The risks due to aseptic loosening, infection, stiffness, patella tracking problems, thromboembolic complications and other imponderables were discussed. The patient acknowledged the explanation, agreed to proceed with the plan and consent was signed. Patient is being admitted for inpatient treatment for surgery, pain control, PT, OT, prophylactic antibiotics, VTE prophylaxis, progressive ambulation and ADL's and discharge planning.The  patient is planning to be discharged home with OPPT after an overnight stay.    Therapy Plans: outpatient therapy. [facility] Kathryne Sharper 01/23/24. Printed copy given to patient.  Disposition: Home with wife.  Planned DVT Prophylaxis: aspirin 81mg  BID DME needed: Has rolling walker.  PCP: Cleared.  TXA: IV Allergies: NDKA. Anesthesia Concerns: None.  BMI: 32.3 Last HgbA1c: Not diabetic.  Other: - Patient is WC. Case manager not present today. RX given for PT and ice machine.  - Oxycodone, zofran, methocarbamol, ibuprofen.  - Labs pending.

## 2024-01-02 NOTE — Progress Notes (Signed)
 Sent message, via epic in basket, requesting orders in epic from Careers adviser.

## 2024-01-02 NOTE — H&P (View-Only) (Signed)
 TOTAL KNEE REVISION ADMISSION H&P  Patient is being admitted for right revision total knee arthroplasty.  Subjective:  Chief Complaint:right knee pain.  HPI: Javier Lawson, 63 y.o. male, has a history of pain and functional disability in the right knee(s) due to failed previous arthroplasty and patient has failed non-surgical conservative treatments for greater than 12 weeks to include NSAID's and/or analgesics, flexibility and strengthening excercises, supervised PT with diminished ADL's post treatment, and activity modification. The indications for the revision of the total knee arthroplasty are  flexion instability . Onset of symptoms was gradual starting 3 years ago with rapidlly worsening course since that time.  Prior procedures on the right knee(s) include arthroplasty.  Patient currently rates pain in the right knee(s) at 10 out of 10 with activity. There is night pain, worsening of pain with activity and weight bearing, pain that interferes with activities of daily living, pain with passive range of motion, crepitus, and joint swelling.  Patient has evidence of  internal rotation of the components, negative bone scan  by imaging studies. This condition presents safety issues increasing the risk of falls.  There is no current active infection.  There are no active problems to display for this patient.  Past Medical History:  Diagnosis Date   Pain in both knees    c/o "aching"- no major problem    Past Surgical History:  Procedure Laterality Date   COLONOSCOPY WITH PROPOFOL N/A 09/30/2015   Procedure: COLONOSCOPY WITH PROPOFOL;  Surgeon: Charolett Bumpers, MD;  Location: [facility];  Service: Endoscopy;  Laterality: N/A;    Current Outpatient Medications  Medication Sig Dispense Refill Last Dose/Taking   acetaminophen (TYLENOL) 500 MG tablet Take 500 mg by mouth every 6 (six) hours as needed for moderate pain (pain score 4-6).      Ascorbic Acid (VITAMIN C) 1000 MG tablet Take 1,000  mg by mouth daily.      diphenhydramine-acetaminophen (TYLENOL PM) 25-500 MG TABS tablet Take 1 tablet by mouth at bedtime as needed (sleep).      No current facility-administered medications for this visit.   No Known Allergies  Social History   Tobacco Use   Smoking status: Never   Smokeless tobacco: Never  Substance Use Topics   Alcohol use: No    No family history on file.    Review of Systems  Musculoskeletal:  Positive for arthralgias, gait problem and joint swelling.  All other systems reviewed and are negative.    Objective:  Physical Exam Constitutional:      Appearance: Normal appearance.  HENT:     Head: Normocephalic and atraumatic.     Nose: Nose normal.     Mouth/Throat:     Mouth: Mucous membranes are moist.     Pharynx: Oropharynx is clear.  Eyes:     Conjunctiva/sclera: Conjunctivae normal.  Cardiovascular:     Rate and Rhythm: Normal rate and regular rhythm.     Pulses: Normal pulses.     Heart sounds: Normal heart sounds.  Pulmonary:     Effort: Pulmonary effort is normal.     Breath sounds: Normal breath sounds.  Abdominal:     General: Abdomen is flat.     Palpations: Abdomen is soft.  Genitourinary:    Comments: Deferred.  Musculoskeletal:     Cervical back: Normal range of motion and neck supple.     Comments: Examination of the right knee reveals a healed anterior incision. Mild swelling. No warmth, erythema, or  effusion. He has some tenderness to palpation over the tibial component. No tenderness to palpation over the femoral component. There is no significant peripatellar retinacular tenderness to palpation. His range of motion is 6 to 125 degrees. There is no varus/valgus instability in full extension. In mid flexion and full flexion, he does have instability. No extensor lag. Painless range of motion the hip  Distally, there is no focal motor or sensory deficit. He has palpable pedal pulses  Skin:    General: Skin is warm and dry.      Capillary Refill: Capillary refill takes less than 2 seconds.  Neurological:     General: No focal deficit present.     Mental Status: He is alert and oriented to person, place, and time.  Psychiatric:        Mood and Affect: Mood normal.        Behavior: Behavior normal.        Thought Content: Thought content normal.        Judgment: Judgment normal.     Vital signs in last 24 hours: @VSRANGES @  Labs:  Estimated body mass index is 32.64 kg/m as calculated from the following:   Height as of 01/31/20: 6\' 1"  (1.854 m).   Weight as of 01/31/20: 112.2 kg.  Imaging Review Plain radiographs demonstrate severe degenerative joint disease of the right knee(s). The overall alignment is neutral.There is evidence no of loosening of the femoral and tibial components. The bone quality appears to be adequate for age and reported activity level. There is internal rotation of the components.    Assessment/Plan:  End stage arthritis, right knee(s) with failed previous arthroplasty.   The patient history, physical examination, clinical judgment of the provider and imaging studies are consistent with end stage degenerative joint disease of the right knee(s), previous total knee arthroplasty. Revision total knee arthroplasty is deemed medically necessary. The treatment options including medical management, injection therapy, arthroscopy and revision arthroplasty were discussed at length. The risks and benefits of revision total knee arthroplasty were presented and reviewed. The risks due to aseptic loosening, infection, stiffness, patella tracking problems, thromboembolic complications and other imponderables were discussed. The patient acknowledged the explanation, agreed to proceed with the plan and consent was signed. Patient is being admitted for inpatient treatment for surgery, pain control, PT, OT, prophylactic antibiotics, VTE prophylaxis, progressive ambulation and ADL's and discharge planning.The  patient is planning to be discharged home with OPPT after an overnight stay.    Therapy Plans: outpatient therapy. [facility] Kathryne Sharper 01/23/24. Printed copy given to patient.  Disposition: Home with wife.  Planned DVT Prophylaxis: aspirin 81mg  BID DME needed: Has rolling walker.  PCP: Cleared.  TXA: IV Allergies: NDKA. Anesthesia Concerns: None.  BMI: 32.3 Last HgbA1c: Not diabetic.  Other: - Patient is WC. Case manager not present today. RX given for PT and ice machine.  - Oxycodone, zofran, methocarbamol, ibuprofen.  - Labs pending.

## 2024-01-04 NOTE — Progress Notes (Addendum)
 COVID Vaccine Completed:  Date of COVID positive in last 90 days:  No  PCP - Delorise Jackson, MD Cardiologist - Donato Schultz, MD [phone])  Chest x-ray - N/A EKG - 01-09-24 Epic Stress Test - N/A ECHO - 02-18-20 Epic Cardiac Cath - N/A Pacemaker/ICD device last checked: Spinal Cord Stimulator: N/A  Bowel Prep - N/A  Sleep Study - N/A CPAP -   Fasting Blood Sugar - N/A Checks Blood Sugar _____ times a day  Last dose of GLP1 agonist-  N/A GLP1 instructions:  Hold 7 days before surgery    Last dose of SGLT-2 inhibitors-  N/A SGLT-2 instructions:  Hold 3 days before surgery   Blood Thinner Instructions:  N/A Time: Aspirin Instructions: Last Dose:  Activity level:  Can go up a flight of stairs and perform activities of daily living without stopping and without symptoms of chest pain or shortness of breath.  Anesthesia review:   Dilated aortic root on echo 43 mm in 2021.  Bradycardia, interventricular conduction delay on EKG  Patient denies shortness of breath, fever, cough and chest pain at PAT appointment  Patient verbalized understanding of instructions that were given to them at the PAT appointment. Patient was also instructed that they will need to review over the PAT instructions again at home before surgery.

## 2024-01-04 NOTE — Patient Instructions (Addendum)
 SURGICAL WAITING ROOM VISITATION Patients having surgery or a procedure may have no more than 2 support people in the waiting area - these visitors may rotate.    Children under the age of 22 must have an adult with them who is not the patient.  Due to an increase in RSV and influenza rates and associated hospitalizations, children ages 14 and under may not visit patients in [facility] hospitals.   If the patient needs to stay at the hospital during part of their recovery, the visitor guidelines for inpatient rooms apply. Pre-op nurse will coordinate an appropriate time for 1 support person to accompany patient in pre-op.  This support person may not rotate.    Please refer to the [facility] website for the visitor guidelines for Inpatients (after your surgery is over and you are in a regular room).       Your procedure is scheduled on: 01-18-24   Report to [facility] Main Entrance    Report to admitting at 8:30 AM   Call this number if you have problems the morning of surgery [phone]   Do not eat food :After Midnight.   After Midnight you may have the following liquids until 8:00 AM DAY OF SURGERY  Water Non-Citrus Juices (without pulp, NO RED-Apple, White grape, White cranberry) Black Coffee (NO MILK/CREAM OR CREAMERS, sugar ok)  Clear Tea (NO MILK/CREAM OR CREAMERS, sugar ok) regular and decaf                             Plain Jell-O (NO RED)                                           Fruit ices (not with fruit pulp, NO RED)                                     Popsicles (NO RED)                                                               Sports drinks like Gatorade (NO RED)                   The day of surgery:  Drink ONE (1) Pre-Surgery Clear Ensure by 8:00 AM the morning of surgery. Drink in one sitting. Do not sip.  This drink was given to you during your hospital  pre-op appointment visit. Nothing else to drink after completing the Pre-Surgery Clear  Ensure .          If you have questions, please contact your surgeon's office.   FOLLOW  ANY ADDITIONAL PRE OP INSTRUCTIONS YOU RECEIVED FROM YOUR SURGEON'S OFFICE!!!     Oral Hygiene is also important to reduce your risk of infection.                                    Remember - BRUSH YOUR TEETH THE MORNING OF SURGERY WITH YOUR REGULAR TOOTHPASTE  Do NOT smoke after Midnight   Take these medicines the morning of surgery with A SIP OF WATER:    Tylenol if needed  Stop all vitamins and herbal supplements 7 days before surgery                              You may not have any metal on your body including  jewelry, and body piercing             Do not wear  lotions, powders, cologne, or deodorant              Men may shave face and neck.   Do not bring valuables to the hospital. [facility] IS NOT RESPONSIBLE   FOR VALUABLES.   Contacts, dentures or bridgework may not be worn into surgery.   Bring small overnight bag day of surgery.   DO NOT BRING YOUR HOME MEDICATIONS TO THE HOSPITAL. PHARMACY WILL DISPENSE MEDICATIONS LISTED ON YOUR MEDICATION LIST TO YOU DURING YOUR ADMISSION IN THE HOSPITAL!   Special Instructions: Bring a copy of your healthcare power of attorney and living will documents the day of surgery if you haven't scanned them before.              Please read over the following fact sheets you were given: IF YOU HAVE QUESTIONS ABOUT YOUR PRE-OP INSTRUCTIONS PLEASE CALL [phone] Gwen  If you received a COVID test during your pre-op visit  it is requested that you wear a mask when out in public, stay away from anyone that may not be feeling well and notify your surgeon if you develop symptoms. If you test positive for Covid or have been in contact with anyone that has tested positive in the last 10 days please notify you surgeon.    Pre-operative 5 CHG Bath Instructions   You can play a key role in reducing the risk of infection after surgery. Your skin needs  to be as free of germs as possible. You can reduce the number of germs on your skin by washing with CHG (chlorhexidine gluconate) soap before surgery. CHG is an antiseptic soap that kills germs and continues to kill germs even after washing.   DO NOT use if you have an allergy to chlorhexidine/CHG or antibacterial soaps. If your skin becomes reddened or irritated, stop using the CHG and notify one of our RNs at [phone].   Please shower with the CHG soap starting 4 days before surgery using the following schedule:     Please keep in mind the following:  DO NOT shave, including legs and underarms, starting the day of your first shower.   You may shave your face at any point before/day of surgery.  Place clean sheets on your bed the day you start using CHG soap. Use a clean washcloth (not used since being washed) for each shower. DO NOT sleep with pets once you start using the CHG.   CHG Shower Instructions:  If you choose to wash your hair and private area, wash first with your normal shampoo/soap.  After you use shampoo/soap, rinse your hair and body thoroughly to remove shampoo/soap residue.  Turn the water OFF and apply about 3 tablespoons (45 ml) of CHG soap to a CLEAN washcloth.  Apply CHG soap ONLY FROM YOUR NECK DOWN TO YOUR TOES (washing for 3-5 minutes)  DO NOT use CHG soap on face, private areas, open wounds, or sores.  Pay special attention to the area where your surgery is being performed.  If you are having back surgery, having someone wash your back for you may be helpful. Wait 2 minutes after CHG soap is applied, then you may rinse off the CHG soap.  Pat dry with a clean towel  Put on clean clothes/pajamas   If you choose to wear lotion, please use ONLY the CHG-compatible lotions on the back of this paper.     Additional instructions for the day of surgery: DO NOT APPLY any lotions, deodorants, cologne, or perfumes.   Put on clean/comfortable clothes.  Brush your  teeth.  Ask your nurse before applying any prescription medications to the skin.      CHG Compatible Lotions   Aveeno Moisturizing lotion  Cetaphil Moisturizing Cream  Cetaphil Moisturizing Lotion  Clairol Herbal Essence Moisturizing Lotion, Dry Skin  Clairol Herbal Essence Moisturizing Lotion, Extra Dry Skin  Clairol Herbal Essence Moisturizing Lotion, Normal Skin  Curel Age Defying Therapeutic Moisturizing Lotion with Alpha Hydroxy  Curel Extreme Care Body Lotion  Curel Soothing Hands Moisturizing Hand Lotion  Curel Therapeutic Moisturizing Cream, Fragrance-Free  Curel Therapeutic Moisturizing Lotion, Fragrance-Free  Curel Therapeutic Moisturizing Lotion, Original Formula  Eucerin Daily Replenishing Lotion  Eucerin Dry Skin Therapy Plus Alpha Hydroxy Crme  Eucerin Dry Skin Therapy Plus Alpha Hydroxy Lotion  Eucerin Original Crme  Eucerin Original Lotion  Eucerin Plus Crme Eucerin Plus Lotion  Eucerin TriLipid Replenishing Lotion  Keri Anti-Bacterial Hand Lotion  Keri Deep Conditioning Original Lotion Dry Skin Formula Softly Scented  Keri Deep Conditioning Original Lotion, Fragrance Free Sensitive Skin Formula  Keri Lotion Fast Absorbing Fragrance Free Sensitive Skin Formula  Keri Lotion Fast Absorbing Softly Scented Dry Skin Formula  Keri Original Lotion  Keri Skin Renewal Lotion Keri Silky Smooth Lotion  Keri Silky Smooth Sensitive Skin Lotion  Nivea Body Creamy Conditioning Oil  Nivea Body Extra Enriched Lotion  Nivea Body Original Lotion  Nivea Body Sheer Moisturizing Lotion Nivea Crme  Nivea Skin Firming Lotion  NutraDerm 30 Skin Lotion  NutraDerm Skin Lotion  NutraDerm Therapeutic Skin Cream  NutraDerm Therapeutic Skin Lotion  ProShield Protective Hand Cream  Provon moisturizing lotion   PATIENT SIGNATURE_________________________________  NURSE  SIGNATURE__________________________________  ________________________________________________________________________    Javier Lawson  An incentive spirometer is a tool that can help keep your lungs clear and active. This tool measures how well you are filling your lungs with each breath. Taking long deep breaths may help reverse or decrease the chance of developing breathing (pulmonary) problems (especially infection) following: A long period of time when you are unable to move or be active. BEFORE THE PROCEDURE  If the spirometer includes an indicator to show your best effort, your nurse or respiratory therapist will set it to a desired goal. If possible, sit up straight or lean slightly forward. Try not to slouch. Hold the incentive spirometer in an upright position. INSTRUCTIONS FOR USE  Sit on the edge of your bed if possible, or sit up as far as you can in bed or on a chair. Hold the incentive spirometer in an upright position. Breathe out normally. Place the mouthpiece in your mouth and seal your lips tightly around it. Breathe in slowly and as deeply as possible, raising the piston or the ball toward the top of the column. Hold your breath for 3-5 seconds or for as long as possible. Allow the piston or ball to fall to the bottom of the column. Remove the mouthpiece  from your mouth and breathe out normally. Rest for a few seconds and repeat Steps 1 through 7 at least 10 times every 1-2 hours when you are awake. Take your time and take a few normal breaths between deep breaths. The spirometer may include an indicator to show your best effort. Use the indicator as a goal to work toward during each repetition. After each set of 10 deep breaths, practice coughing to be sure your lungs are clear. If you have an incision (the cut made at the time of surgery), support your incision when coughing by placing a pillow or rolled up towels firmly against it. Once you are able to get out of  bed, walk around indoors and cough well. You may stop using the incentive spirometer when instructed by your caregiver.  RISKS AND COMPLICATIONS Take your time so you do not get dizzy or light-headed. If you are in pain, you may need to take or ask for pain medication before doing incentive spirometry. It is harder to take a deep breath if you are having pain. AFTER USE Rest and breathe slowly and easily. It can be helpful to keep track of a log of your progress. Your caregiver can provide you with a simple table to help with this. If you are using the spirometer at home, follow these instructions: SEEK MEDICAL CARE IF:  You are having difficultly using the spirometer. You have trouble using the spirometer as often as instructed. Your pain medication is not giving enough relief while using the spirometer. You develop fever of 100.5 F (38.1 C) or higher. SEEK IMMEDIATE MEDICAL CARE IF:  You cough up bloody sputum that had not been present before. You develop fever of 102 F (38.9 C) or greater. You develop worsening pain at or near the incision site. MAKE SURE YOU:  Understand these instructions. Will watch your condition. Will get help right away if you are not doing well or get worse. Document Released: 03/07/2007 Document Revised: 01/17/2012 Document Reviewed: 05/08/2007 Exit[employer] Patient Information 2014 [employer], [place].   ________________________________________________________________________ WHAT IS A BLOOD TRANSFUSION? Blood Transfusion Information  A transfusion is the replacement of blood or some of its parts. Blood is made up of multiple cells which provide different functions. Red blood cells carry oxygen and are used for blood loss replacement. White blood cells fight against infection. Platelets control bleeding. Plasma helps clot blood. Other blood products are available for specialized needs, such as hemophilia or other clotting disorders. BEFORE THE TRANSFUSION   Who gives blood for transfusions?  Healthy volunteers who are fully evaluated to make sure their blood is safe. This is blood bank blood. Transfusion therapy is the safest it has ever been in the practice of medicine. Before blood is taken from a donor, a complete history is taken to make sure that person has no history of diseases nor engages in risky social behavior (examples are intravenous drug use or sexual activity with multiple partners). The donor's travel history is screened to minimize risk of transmitting infections, such as malaria. The donated blood is tested for signs of infectious diseases, such as HIV and hepatitis. The blood is then tested to be sure it is compatible with you in order to minimize the chance of a transfusion reaction. If you or a relative donates blood, this is often done in anticipation of surgery and is not appropriate for emergency situations. It takes many days to process the donated blood. RISKS AND COMPLICATIONS Although transfusion therapy is very safe  and saves many lives, the main dangers of transfusion include:  Getting an infectious disease. Developing a transfusion reaction. This is an allergic reaction to something in the blood you were given. Every precaution is taken to prevent this. The decision to have a blood transfusion has been considered carefully by your caregiver before blood is given. Blood is not given unless the benefits outweigh the risks. AFTER THE TRANSFUSION Right after receiving a blood transfusion, you will usually feel much better and more energetic. This is especially true if your red blood cells have gotten low (anemic). The transfusion raises the level of the red blood cells which carry oxygen, and this usually causes an energy increase. The nurse administering the transfusion will monitor you carefully for complications. HOME CARE INSTRUCTIONS  No special instructions are needed after a transfusion. You may find your energy is  better. Speak with your caregiver about any limitations on activity for underlying diseases you may have. SEEK MEDICAL CARE IF:  Your condition is not improving after your transfusion. You develop redness or irritation at the intravenous (IV) site. SEEK IMMEDIATE MEDICAL CARE IF:  Any of the following symptoms occur over the next 12 hours: Shaking chills. You have a temperature by mouth above 102 F (38.9 C), not controlled by medicine. Chest, back, or muscle pain. People around you feel you are not acting correctly or are confused. Shortness of breath or difficulty breathing. Dizziness and fainting. You get a rash or develop hives. You have a decrease in urine output. Your urine turns a dark color or changes to pink, red, or brown. Any of the following symptoms occur over the next 10 days: You have a temperature by mouth above 102 F (38.9 C), not controlled by medicine. Shortness of breath. Weakness after normal activity. The white part of the eye turns yellow (jaundice). You have a decrease in the amount of urine or are urinating less often. Your urine turns a dark color or changes to pink, red, or brown. Document Released: 10/22/2000 Document Revised: 01/17/2012 Document Reviewed: 06/10/2008 [facility] Patient Information 2014 [place], [place].  _______________________________________________________________________

## 2024-01-05 ENCOUNTER — Ambulatory Visit: Payer: Self-pay | Admitting: Student

## 2024-01-05 NOTE — Progress Notes (Signed)
Second request for pre op orders in [facility]: Spoke with [facility].

## 2024-01-09 ENCOUNTER — Encounter (HOSPITAL_COMMUNITY): Payer: Self-pay

## 2024-01-09 ENCOUNTER — Encounter (HOSPITAL_COMMUNITY)
Admission: RE | Admit: 2024-01-09 | Discharge: 2024-01-09 | Disposition: A | Discharge status: Home / Self Care | Payer: BC Managed Care – PPO | Source: Ambulatory Visit | Attending: Orthopedic Surgery

## 2024-01-09 ENCOUNTER — Other Ambulatory Visit: Payer: Self-pay

## 2024-01-09 VITALS — BP 130/89 | HR 50 | Temp 97.9°F | Resp 16 | Ht 74.0 in | Wt 246.6 lb

## 2024-01-09 DIAGNOSIS — Z01818 Encounter for other preprocedural examination: Secondary | ICD-10-CM | POA: Diagnosis present

## 2024-01-09 DIAGNOSIS — I2510 Atherosclerotic heart disease of native coronary artery without angina pectoris: Secondary | ICD-10-CM | POA: Insufficient documentation

## 2024-01-09 HISTORY — DX: Bradycardia, unspecified: R00.1

## 2024-01-09 HISTORY — DX: Unspecified osteoarthritis, unspecified site: M19.90

## 2024-01-09 LAB — CBC
HCT: 46.3 % (ref 39.0–52.0)
Hemoglobin: 15.4 g/dL (ref 13.0–17.0)
MCH: 29.5 pg (ref 26.0–34.0)
MCHC: 33.3 g/dL (ref 30.0–36.0)
MCV: 88.7 fL (ref 80.0–100.0)
Platelets: 263 10*3/uL (ref 150–400)
RBC: 5.22 MIL/uL (ref 4.22–5.81)
RDW: 13.3 % (ref 11.5–15.5)
WBC: 4.6 10*3/uL (ref 4.0–10.5)
nRBC: 0 % (ref 0.0–0.2)

## 2024-01-09 LAB — BASIC METABOLIC PANEL
Anion gap: 8 (ref 5–15)
BUN: 24 mg/dL — ABNORMAL HIGH (ref 8–23)
CO2: 23 mmol/L (ref 22–32)
Calcium: 9.4 mg/dL (ref 8.9–10.3)
Chloride: 106 mmol/L (ref 98–111)
Creatinine, Ser: 0.96 mg/dL (ref 0.61–1.24)
GFR, Estimated: >60 mL/min (ref >60)
Glucose, Bld: 94 mg/dL (ref 70–99)
Potassium: 3.8 mmol/L (ref 3.5–5.1)
Sodium: 137 mmol/L (ref 135–145)

## 2024-01-09 LAB — SURGICAL PCR SCREEN
MRSA, PCR: NEGATIVE
Staphylococcus aureus: NEGATIVE

## 2024-01-10 NOTE — Progress Notes (Signed)
 DISCUSSION: Javier Lawson is a 63 yo male who presents to PAT prior to a right TOTAL KNEE REVISION with Dr Veda Canning on 01/18/24. PMH of mildly dilated aortic root, hx of IVCD, arthritis s/p R TKA in 2021  Patient saw Cardiology in 2021 for pre op clearance due to hx of bradycardia and IVCD. Echo was ordered which showed normal EF and mildly dilated aortic root measuring 43mm. He was advised to repeat echo in 1 year but has been lost to follow up. Patient does follow with PCP. Last seen on 02/10/23. Medical clearance signed patient is cleared as low risk from medical and cardiac perspective signed 11/15/23 (scanned in media on 11/16/23).  VS: BP 130/89   Pulse (!) 50   Temp 36.6 C (Oral)   Resp 16   Ht 6\' 2"  (1.88 m)   Wt 111.9 kg   SpO2 99%   BMI 31.66 kg/m   PROVIDERS: Valla Leaver, MD   LABS: Labs reviewed: Acceptable for surgery. (all labs ordered are listed, but only abnormal results are displayed)  Labs Reviewed  BASIC METABOLIC PANEL - Abnormal; Notable for the following components:      Result Value   BUN 24 (*)    All other components within normal limits  SURGICAL PCR SCREEN  CBC  TYPE AND SCREEN     IMAGES:   EKG 01/09/24: Sinus bradycardia, rate 51 Non-specific intra-ventricular conduction delay  CV:  Echo 02/19/20:  IMPRESSIONS     1. Poor acoustic windows limit study. Endocardium is not well seen in all  views.   2. Left ventricular ejection fraction, by estimation, is 50%%. The left  ventricle has mildly decreased function. The left ventricle has no  regional wall motion abnormalities. The left ventricular internal cavity  size was moderately to severely dilated.  Left ventricular diastolic parameters are consistent with Grade I  diastolic dysfunction (impaired relaxation).   3. Right ventricular systolic function is mildly reduced. The right  ventricular size is mildly enlarged.   4. Left atrial size was mildly dilated.   5. The mitral valve  is grossly normal. Trivial mitral valve  regurgitation.   6. The aortic valve is tricuspid. Aortic valve regurgitation is trivial.  Mild aortic valve sclerosis is present, with no evidence of aortic valve  stenosis.   7. Aortic dilatation noted. There is mild dilatation of the aortic root  measuring 43 mm.   8. The inferior vena cava is normal in size with greater than 50%  respiratory variability, suggesting right atrial pressure of 3 mmHg.   Past Medical History:  Diagnosis Date   Arthritis    Bradycardia    Pain in both knees    c/o "aching"- no major problem    Past Surgical History:  Procedure Laterality Date   COLONOSCOPY WITH PROPOFOL N/A 09/30/2015   Procedure: COLONOSCOPY WITH PROPOFOL;  Surgeon: Charolett Bumpers, MD;  Location: [facility] ENDOSCOPY;  Service: Endoscopy;  Laterality: N/A;   KNEE ARTHROSCOPY Right    REPLACEMENT TOTAL KNEE Right     MEDICATIONS:  acetaminophen (TYLENOL) 500 MG tablet   Ascorbic Acid (VITAMIN C) 1000 MG tablet   diphenhydramine-acetaminophen (TYLENOL PM) 25-500 MG TABS tablet   No current facility-administered medications for this encounter.   Marcille Blanco [facility] Surgical Short Stay/Anesthesiology [facility] Phone [phone] 01/10/2024 2:53 PM

## 2024-01-10 NOTE — Anesthesia Preprocedure Evaluation (Addendum)
 Anesthesia Evaluation  Patient identified by MRN, date of birth, ID band Patient awake    Reviewed: Allergy & Precautions, NPO status , Patient's Chart, lab work & pertinent test results  Airway Mallampati: II  TM Distance: >3 FB Neck ROM: Full    Dental  (+) Dental Advisory Given   Pulmonary neg pulmonary ROS   breath sounds clear to auscultation       Cardiovascular negative cardio ROS  Rhythm:Regular Rate:Normal     Neuro/Psych negative neurological ROS     GI/Hepatic negative GI ROS, Neg liver ROS,,,  Endo/Other  negative endocrine ROS    Renal/GU negative Renal ROS     Musculoskeletal  (+) Arthritis ,    Abdominal   Peds  Hematology negative hematology ROS (+)   Anesthesia Other Findings   Reproductive/Obstetrics                             Anesthesia Physical Anesthesia Plan  ASA: 2  Anesthesia Plan: General   Post-op Pain Management: Regional block* and Tylenol PO (pre-op)*   Induction: Intravenous  PONV Risk Score and Plan: 2 and Dexamethasone, Ondansetron, Midazolam and Treatment may vary due to age or medical condition  Airway Management Planned: Oral ETT  Additional Equipment:   Intra-op Plan:   Post-operative Plan: Extubation in OR  Informed Consent: I have reviewed the patients History and Physical, chart, labs and discussed the procedure including the risks, benefits and alternatives for the proposed anesthesia with the patient or authorized representative who has indicated his/her understanding and acceptance.     Dental advisory given  Plan Discussed with: CRNA  Anesthesia Plan Comments: (Pt refuses SAB. Plan GETA, 2IV's, T/S )        Anesthesia Quick Evaluation

## 2024-01-18 ENCOUNTER — Encounter (HOSPITAL_COMMUNITY)
Admission: RE | Disposition: A | Discharge status: Home / Self Care | Payer: Self-pay | Source: Ambulatory Visit | Attending: Orthopedic Surgery

## 2024-01-18 ENCOUNTER — Inpatient Hospital Stay (HOSPITAL_COMMUNITY): Payer: Self-pay

## 2024-01-18 ENCOUNTER — Inpatient Hospital Stay (HOSPITAL_COMMUNITY): Payer: Self-pay | Admitting: Medical

## 2024-01-18 ENCOUNTER — Inpatient Hospital Stay (HOSPITAL_COMMUNITY)
Admission: RE | Admit: 2024-01-18 | Discharge: 2024-01-20 | DRG: 467 | Disposition: A | Discharge status: Home / Self Care | Payer: BC Managed Care – PPO | Source: Ambulatory Visit | Attending: Orthopedic Surgery | Admitting: Orthopedic Surgery

## 2024-01-18 ENCOUNTER — Other Ambulatory Visit: Payer: Self-pay

## 2024-01-18 ENCOUNTER — Encounter (HOSPITAL_COMMUNITY): Payer: Self-pay | Admitting: Orthopedic Surgery

## 2024-01-18 ENCOUNTER — Inpatient Hospital Stay (HOSPITAL_COMMUNITY)

## 2024-01-18 DIAGNOSIS — Y792 Prosthetic and other implants, materials and accessory orthopedic devices associated with adverse incidents: Secondary | ICD-10-CM | POA: Diagnosis present

## 2024-01-18 DIAGNOSIS — M1711 Unilateral primary osteoarthritis, right knee: Secondary | ICD-10-CM | POA: Diagnosis present

## 2024-01-18 DIAGNOSIS — T84092A Other mechanical complication of internal right knee prosthesis, initial encounter: Secondary | ICD-10-CM | POA: Diagnosis present

## 2024-01-18 DIAGNOSIS — Z96651 Presence of right artificial knee joint: Secondary | ICD-10-CM

## 2024-01-18 DIAGNOSIS — D62 Acute posthemorrhagic anemia: Secondary | ICD-10-CM | POA: Diagnosis not present

## 2024-01-18 DIAGNOSIS — T84012A Broken internal right knee prosthesis, initial encounter: Principal | ICD-10-CM

## 2024-01-18 HISTORY — PX: TOTAL KNEE REVISION: SHX996

## 2024-01-18 LAB — URINALYSIS, W/ REFLEX TO CULTURE (INFECTION SUSPECTED)
Bacteria, UA: NONE SEEN
Bilirubin Urine: NEGATIVE
Glucose, UA: NEGATIVE mg/dL
Hgb urine dipstick: NEGATIVE
Ketones, ur: NEGATIVE mg/dL
Leukocytes,Ua: NEGATIVE
Nitrite: NEGATIVE
Protein, ur: 30 mg/dL — AB
Specific Gravity, Urine: 1.036 — ABNORMAL HIGH (ref 1.005–1.030)
pH: 5 (ref 5.0–8.0)

## 2024-01-18 LAB — TYPE AND SCREEN
ABO/RH(D): A POS
Antibody Screen: NEGATIVE

## 2024-01-18 LAB — ABO/RH: ABO/RH(D): A POS

## 2024-01-18 SURGERY — TOTAL KNEE REVISION
Anesthesia: General | Site: Knee | Laterality: Right

## 2024-01-18 MED ORDER — PROPOFOL 10 MG/ML IV BOLUS
INTRAVENOUS | Status: DC | PRN
  Administered 2024-01-18: 150 mg via INTRAVENOUS

## 2024-01-18 MED ORDER — ROPIVACAINE HCL 5 MG/ML IJ SOLN
INTRAMUSCULAR | Status: DC | PRN
  Administered 2024-01-18: 25 mL via PERINEURAL

## 2024-01-18 MED ORDER — CLONIDINE HCL (ANALGESIA) 100 MCG/ML EP SOLN
EPIDURAL | Status: DC | PRN
  Administered 2024-01-18: 50 ug

## 2024-01-18 MED ORDER — 0.9 % SODIUM CHLORIDE (POUR BTL) OPTIME
TOPICAL | Status: DC | PRN
  Administered 2024-01-18: 1000 mL

## 2024-01-18 MED ORDER — ROCURONIUM BROMIDE 100 MG/10ML IV SOLN
INTRAVENOUS | Status: DC | PRN
  Administered 2024-01-18: 60 mg via INTRAVENOUS

## 2024-01-18 MED ORDER — LIDOCAINE HCL (CARDIAC) PF 100 MG/5ML IV SOSY
PREFILLED_SYRINGE | INTRAVENOUS | Status: DC | PRN
  Administered 2024-01-18: 80 mg via INTRAVENOUS

## 2024-01-18 MED ORDER — STERILE WATER FOR IRRIGATION IR SOLN
Status: DC | PRN
  Administered 2024-01-18: 2000 mL

## 2024-01-18 MED ORDER — PANTOPRAZOLE SODIUM 40 MG PO TBEC
40.0000 mg | DELAYED_RELEASE_TABLET | Freq: Every day | ORAL | Status: DC
  Administered 2024-01-18 – 2024-01-20 (×3): 40 mg via ORAL
  Filled 2024-01-18 (×3): qty 1

## 2024-01-18 MED ORDER — HYDROMORPHONE HCL 1 MG/ML IJ SOLN
0.2500 mg | INTRAMUSCULAR | Status: DC | PRN
  Administered 2024-01-18 (×2): 0.5 mg via INTRAVENOUS

## 2024-01-18 MED ORDER — DEXAMETHASONE SODIUM PHOSPHATE 10 MG/ML IJ SOLN
INTRAMUSCULAR | Status: DC | PRN
Start: 2024-01-18 — End: 2024-01-18
  Administered 2024-01-18: 10 mg via INTRAVENOUS

## 2024-01-18 MED ORDER — OXYCODONE HCL 5 MG/5ML PO SOLN: 5.0000 mg | Freq: Once | ORAL | Status: DC | PRN

## 2024-01-18 MED ORDER — ONDANSETRON HCL 4 MG/2ML IJ SOLN: 4.0000 mg | Freq: Four times a day (QID) | INTRAMUSCULAR | Status: DC | PRN

## 2024-01-18 MED ORDER — BUPIVACAINE-EPINEPHRINE (PF) 0.25% -1:200000 IJ SOLN
INTRAMUSCULAR | Status: AC
  Filled 2024-01-18: qty 30

## 2024-01-18 MED ORDER — PROPOFOL 500 MG/50ML IV EMUL
INTRAVENOUS | Status: DC | PRN
Start: 2024-01-18 — End: 2024-01-18
  Administered 2024-01-18: 125 ug/kg/min via INTRAVENOUS

## 2024-01-18 MED ORDER — METOCLOPRAMIDE HCL 5 MG/ML IJ SOLN: 5.0000 mg | Freq: Three times a day (TID) | INTRAMUSCULAR | Status: DC | PRN

## 2024-01-18 MED ORDER — SODIUM CHLORIDE 0.9 % IR SOLN
Status: DC | PRN
  Administered 2024-01-18: 3250 mL

## 2024-01-18 MED ORDER — PROPOFOL 1000 MG/100ML IV EMUL
INTRAVENOUS | Status: AC
  Filled 2024-01-18: qty 100

## 2024-01-18 MED ORDER — ORAL CARE MOUTH RINSE: 15.0000 mL | Freq: Once | OROMUCOSAL | Status: AC

## 2024-01-18 MED ORDER — DEXAMETHASONE SODIUM PHOSPHATE 10 MG/ML IJ SOLN
INTRAMUSCULAR | Status: AC
  Filled 2024-01-18: qty 1

## 2024-01-18 MED ORDER — ACETAMINOPHEN 500 MG PO TABS
1000.0000 mg | ORAL_TABLET | Freq: Once | ORAL | Status: AC
Start: 2024-01-18 — End: 2024-01-18
  Administered 2024-01-18: 1000 mg via ORAL
  Filled 2024-01-18: qty 2

## 2024-01-18 MED ORDER — ALBUMIN HUMAN 5 % IV SOLN: INTRAVENOUS | Status: DC | PRN

## 2024-01-18 MED ORDER — PROPOFOL 500 MG/50ML IV EMUL
INTRAVENOUS | Status: AC
Start: 2024-01-18 — End: ?
  Filled 2024-01-18: qty 50

## 2024-01-18 MED ORDER — ONDANSETRON HCL 4 MG/2ML IJ SOLN
INTRAMUSCULAR | Status: AC
  Filled 2024-01-18: qty 2

## 2024-01-18 MED ORDER — CHLORHEXIDINE GLUCONATE 0.12 % MT SOLN
15.0000 mL | Freq: Once | OROMUCOSAL | Status: AC
  Administered 2024-01-18: 15 mL via OROMUCOSAL

## 2024-01-18 MED ORDER — ACETAMINOPHEN 500 MG PO TABS
1000.0000 mg | ORAL_TABLET | Freq: Four times a day (QID) | ORAL | Status: AC
Start: 2024-01-18 — End: 2024-01-19
  Administered 2024-01-18 – 2024-01-19 (×4): 1000 mg via ORAL
  Filled 2024-01-18 (×4): qty 2

## 2024-01-18 MED ORDER — METHOCARBAMOL 1000 MG/10ML IJ SOLN: 500.0000 mg | Freq: Four times a day (QID) | INTRAMUSCULAR | Status: DC | PRN

## 2024-01-18 MED ORDER — SODIUM CHLORIDE 0.9 % IV SOLN
INTRAVENOUS | Status: DC
Start: 2024-01-18 — End: 2024-01-20

## 2024-01-18 MED ORDER — ALUM & MAG HYDROXIDE-SIMETH 200-200-20 MG/5ML PO SUSP: 30.0000 mL | ORAL | Status: DC | PRN

## 2024-01-18 MED ORDER — SUGAMMADEX SODIUM 200 MG/2ML IV SOLN
INTRAVENOUS | Status: AC
  Filled 2024-01-18: qty 2

## 2024-01-18 MED ORDER — METHOCARBAMOL 500 MG PO TABS
500.0000 mg | ORAL_TABLET | Freq: Four times a day (QID) | ORAL | Status: DC | PRN
  Administered 2024-01-18 – 2024-01-20 (×4): 500 mg via ORAL
  Filled 2024-01-18 (×4): qty 1

## 2024-01-18 MED ORDER — ISOPROPYL ALCOHOL 70 % SOLN
Status: DC | PRN
  Administered 2024-01-18: 1 via TOPICAL

## 2024-01-18 MED ORDER — KETOROLAC TROMETHAMINE 15 MG/ML IJ SOLN
INTRAMUSCULAR | Status: AC
  Filled 2024-01-18: qty 1

## 2024-01-18 MED ORDER — SENNA 8.6 MG PO TABS
1.0000 | ORAL_TABLET | Freq: Two times a day (BID) | ORAL | Status: DC
  Administered 2024-01-18 – 2024-01-20 (×4): 8.6 mg via ORAL
  Filled 2024-01-18 (×4): qty 1

## 2024-01-18 MED ORDER — CEFAZOLIN SODIUM-DEXTROSE 2-4 GM/100ML-% IV SOLN
2.0000 g | INTRAVENOUS | Status: AC
  Administered 2024-01-18 (×2): 2 g via INTRAVENOUS
  Filled 2024-01-18: qty 100

## 2024-01-18 MED ORDER — PHENYLEPHRINE HCL-NACL 20-0.9 MG/250ML-% IV SOLN
INTRAVENOUS | Status: DC | PRN
  Administered 2024-01-18: 25 ug/min via INTRAVENOUS

## 2024-01-18 MED ORDER — HYDROMORPHONE HCL 1 MG/ML IJ SOLN
0.5000 mg | INTRAMUSCULAR | Status: DC | PRN
Start: 2024-01-18 — End: 2024-01-20
  Administered 2024-01-18 – 2024-01-20 (×3): 1 mg via INTRAVENOUS
  Filled 2024-01-18 (×3): qty 1

## 2024-01-18 MED ORDER — ASPIRIN 81 MG PO CHEW
81.0000 mg | CHEWABLE_TABLET | Freq: Two times a day (BID) | ORAL | Status: DC
  Administered 2024-01-18 – 2024-01-20 (×4): 81 mg via ORAL
  Filled 2024-01-18 (×4): qty 1

## 2024-01-18 MED ORDER — POLYETHYLENE GLYCOL 3350 17 G PO PACK: 17.0000 g | PACK | Freq: Every day | ORAL | Status: DC | PRN

## 2024-01-18 MED ORDER — PHENOL 1.4 % MT LIQD: 1.0000 | OROMUCOSAL | Status: DC | PRN

## 2024-01-18 MED ORDER — SUGAMMADEX SODIUM 200 MG/2ML IV SOLN
INTRAVENOUS | Status: DC | PRN
  Administered 2024-01-18: 200 mg via INTRAVENOUS

## 2024-01-18 MED ORDER — VITAMIN C 500 MG PO TABS
1000.0000 mg | ORAL_TABLET | Freq: Every day | ORAL | Status: DC
  Administered 2024-01-19 – 2024-01-20 (×2): 1000 mg via ORAL
  Filled 2024-01-18 (×2): qty 2

## 2024-01-18 MED ORDER — MIDAZOLAM HCL 2 MG/2ML IJ SOLN
1.0000 mg | Freq: Once | INTRAMUSCULAR | Status: AC
  Administered 2024-01-18: 1 mg via INTRAVENOUS
  Filled 2024-01-18: qty 2

## 2024-01-18 MED ORDER — DIPHENHYDRAMINE HCL 12.5 MG/5ML PO ELIX: 12.5000 mg | ORAL_SOLUTION | ORAL | Status: DC | PRN

## 2024-01-18 MED ORDER — ONDANSETRON HCL 4 MG PO TABS: 4.0000 mg | ORAL_TABLET | Freq: Four times a day (QID) | ORAL | Status: DC | PRN

## 2024-01-18 MED ORDER — EPHEDRINE SULFATE-NACL 50-0.9 MG/10ML-% IV SOSY
PREFILLED_SYRINGE | INTRAVENOUS | Status: DC | PRN
  Administered 2024-01-18: 10 mg via INTRAVENOUS

## 2024-01-18 MED ORDER — FENTANYL CITRATE (PF) 250 MCG/5ML IJ SOLN
INTRAMUSCULAR | Status: AC
  Filled 2024-01-18: qty 5

## 2024-01-18 MED ORDER — HYDROMORPHONE HCL 1 MG/ML IJ SOLN
INTRAMUSCULAR | Status: AC
  Filled 2024-01-18: qty 1

## 2024-01-18 MED ORDER — CEFAZOLIN SODIUM-DEXTROSE 2-4 GM/100ML-% IV SOLN
2.0000 g | Freq: Four times a day (QID) | INTRAVENOUS | Status: AC
  Administered 2024-01-18 – 2024-01-19 (×2): 2 g via INTRAVENOUS
  Filled 2024-01-18 (×2): qty 100

## 2024-01-18 MED ORDER — ACETAMINOPHEN 325 MG PO TABS: 325.0000 mg | ORAL_TABLET | Freq: Four times a day (QID) | ORAL | Status: DC | PRN

## 2024-01-18 MED ORDER — SODIUM CHLORIDE (PF) 0.9 % IJ SOLN
INTRAMUSCULAR | Status: AC
  Filled 2024-01-18: qty 50

## 2024-01-18 MED ORDER — POVIDONE-IODINE 10 % EX SWAB: 2.0000 | Freq: Once | CUTANEOUS | Status: DC

## 2024-01-18 MED ORDER — OXYCODONE HCL 5 MG PO TABS
10.0000 mg | ORAL_TABLET | ORAL | Status: DC | PRN
  Administered 2024-01-18: 10 mg via ORAL
  Administered 2024-01-19 (×5): 15 mg via ORAL
  Administered 2024-01-20: 10 mg via ORAL
  Administered 2024-01-20 (×2): 15 mg via ORAL
  Filled 2024-01-18 (×3): qty 3
  Filled 2024-01-18: qty 2
  Filled 2024-01-18 (×5): qty 3

## 2024-01-18 MED ORDER — METOCLOPRAMIDE HCL 5 MG PO TABS: 5.0000 mg | ORAL_TABLET | Freq: Three times a day (TID) | ORAL | Status: DC | PRN

## 2024-01-18 MED ORDER — OXYCODONE HCL 5 MG PO TABS
ORAL_TABLET | ORAL | Status: AC
  Filled 2024-01-18: qty 1

## 2024-01-18 MED ORDER — OXYCODONE HCL 5 MG PO TABS: 5.0000 mg | ORAL_TABLET | Freq: Once | ORAL | Status: DC | PRN

## 2024-01-18 MED ORDER — KETAMINE HCL 50 MG/5ML IJ SOSY
PREFILLED_SYRINGE | INTRAMUSCULAR | Status: AC
  Filled 2024-01-18: qty 5

## 2024-01-18 MED ORDER — ALBUMIN HUMAN 5 % IV SOLN
INTRAVENOUS | Status: AC
  Filled 2024-01-18: qty 250

## 2024-01-18 MED ORDER — DOCUSATE SODIUM 100 MG PO CAPS
100.0000 mg | ORAL_CAPSULE | Freq: Two times a day (BID) | ORAL | Status: DC
  Administered 2024-01-18 – 2024-01-20 (×4): 100 mg via ORAL
  Filled 2024-01-18 (×4): qty 1

## 2024-01-18 MED ORDER — LACTATED RINGERS IV SOLN
INTRAVENOUS | Status: DC
Start: 2024-01-18 — End: 2024-01-18

## 2024-01-18 MED ORDER — POVIDONE-IODINE 10 % EX SWAB
2.0000 | Freq: Once | CUTANEOUS | Status: AC
  Administered 2024-01-18: 2 via TOPICAL

## 2024-01-18 MED ORDER — TRANEXAMIC ACID-NACL 1000-0.7 MG/100ML-% IV SOLN
1000.0000 mg | INTRAVENOUS | Status: AC
  Administered 2024-01-18: 1000 mg via INTRAVENOUS
  Filled 2024-01-18: qty 100

## 2024-01-18 MED ORDER — ACETAMINOPHEN 500 MG PO TABS
ORAL_TABLET | ORAL | Status: AC
  Filled 2024-01-18: qty 2

## 2024-01-18 MED ORDER — OXYCODONE HCL 5 MG PO TABS
5.0000 mg | ORAL_TABLET | ORAL | Status: DC | PRN
Start: 2024-01-18 — End: 2024-01-20
  Filled 2024-01-18: qty 2

## 2024-01-18 MED ORDER — KETOROLAC TROMETHAMINE 15 MG/ML IJ SOLN
15.0000 mg | Freq: Four times a day (QID) | INTRAMUSCULAR | Status: AC
  Administered 2024-01-18 – 2024-01-19 (×4): 15 mg via INTRAVENOUS
  Filled 2024-01-18 (×3): qty 1

## 2024-01-18 MED ORDER — MENTHOL 3 MG MT LOZG: 1.0000 | LOZENGE | OROMUCOSAL | Status: DC | PRN

## 2024-01-18 MED ORDER — AMISULPRIDE (ANTIEMETIC) 5 MG/2ML IV SOLN: 10.0000 mg | Freq: Once | INTRAVENOUS | Status: DC | PRN

## 2024-01-18 MED ORDER — ONDANSETRON HCL 4 MG/2ML IJ SOLN
INTRAMUSCULAR | Status: DC | PRN
  Administered 2024-01-18: 4 mg via INTRAVENOUS

## 2024-01-18 MED ORDER — FENTANYL CITRATE (PF) 250 MCG/5ML IJ SOLN
INTRAMUSCULAR | Status: DC | PRN
  Administered 2024-01-18: 100 ug via INTRAVENOUS

## 2024-01-18 MED ORDER — KETOROLAC TROMETHAMINE 30 MG/ML IJ SOLN
INTRAMUSCULAR | Status: AC
  Filled 2024-01-18: qty 1

## 2024-01-18 MED ORDER — CEFAZOLIN SODIUM-DEXTROSE 2-4 GM/100ML-% IV SOLN
INTRAVENOUS | Status: AC
  Filled 2024-01-18: qty 100

## 2024-01-18 MED ORDER — KETAMINE HCL 50 MG/5ML IJ SOSY
PREFILLED_SYRINGE | INTRAMUSCULAR | Status: DC | PRN
Start: 2024-01-18 — End: 2024-01-18
  Administered 2024-01-18: 10 mg via INTRAVENOUS
  Administered 2024-01-18 (×2): 20 mg via INTRAVENOUS

## 2024-01-18 MED ORDER — BISACODYL 10 MG RE SUPP: 10.0000 mg | Freq: Every day | RECTAL | Status: DC | PRN

## 2024-01-18 MED ORDER — FENTANYL CITRATE PF 50 MCG/ML IJ SOSY
50.0000 ug | PREFILLED_SYRINGE | Freq: Once | INTRAMUSCULAR | Status: AC
  Administered 2024-01-18: 50 ug via INTRAVENOUS
  Filled 2024-01-18: qty 2

## 2024-01-18 MED ORDER — PROPOFOL 10 MG/ML IV BOLUS
INTRAVENOUS | Status: AC
  Filled 2024-01-18: qty 20

## 2024-01-18 SURGICAL SUPPLY — 77 items
BAG COUNTER SPONGE SURGICOUNT (BAG) IMPLANT
BAG DECANTER FOR FLEXI CONT (MISCELLANEOUS) ×2 IMPLANT
BAG ZIPLOCK 12X15 (MISCELLANEOUS) ×1 IMPLANT
BLADE OSTEOTOM VER 15.5X100 (ORTHOPEDIC DISPOSABLE SUPPLIES) IMPLANT
BLADE OSTEOTOME VER FLAT 11X50 (ORTHOPEDIC DISPOSABLE SUPPLIES) IMPLANT
BLADE SAW SGTL 81X20 HD (BLADE) ×1 IMPLANT
BLADE SURG SZ10 CARB STEEL (BLADE) ×1 IMPLANT
BNDG ELASTIC 4INX 5YD STR LF (GAUZE/BANDAGES/DRESSINGS) ×1 IMPLANT
BNDG ELASTIC 6INX 5YD STR LF (GAUZE/BANDAGES/DRESSINGS) ×1 IMPLANT
CEMENT BONE REFOBACIN R1X40 US (Cement) IMPLANT
CEMENT RESTRICTOR BONE PREP ST (Cement) IMPLANT
CHLORAPREP W/TINT 26 (MISCELLANEOUS) ×2 IMPLANT
COMP FEM CMT PS PLUS 11 RT (Joint) ×1 IMPLANT
COMPONENT FEM CMT PS PLUS 11RT (Joint) IMPLANT
COMPONENT TIB PSN REV RT SZG (Miscellaneous) ×1 IMPLANT
COMPONET TIB PSN REV RT SZG (Miscellaneous) IMPLANT
CONE TIB CENTRAL TM MED (Joint) IMPLANT
COVER SURGICAL LIGHT HANDLE (MISCELLANEOUS) ×1 IMPLANT
CUFF TRNQT CYL 34X4.125X (TOURNIQUET CUFF) ×1 IMPLANT
DERMABOND ADVANCED .7 DNX12 (GAUZE/BANDAGES/DRESSINGS) ×2 IMPLANT
DRAPE INCISE IOBAN 66X45 STRL (DRAPES) IMPLANT
DRAPE SHEET LG 3/4 BI-LAMINATE (DRAPES) ×3 IMPLANT
DRAPE U-SHAPE 47X51 STRL (DRAPES) ×1 IMPLANT
DRSG AQUACEL AG ADV 3.5X10 (GAUZE/BANDAGES/DRESSINGS) ×1 IMPLANT
DRSG AQUACEL AG ADV 3.5X14 (GAUZE/BANDAGES/DRESSINGS) IMPLANT
DRSG TEGADERM 4X4.75 (GAUZE/BANDAGES/DRESSINGS) ×1 IMPLANT
ELECT BLADE TIP CTD 4 INCH (ELECTRODE) ×1 IMPLANT
ELECT REM PT RETURN 15FT ADLT (MISCELLANEOUS) ×1 IMPLANT
EVACUATOR 1/8 PVC DRAIN (DRAIN) ×1 IMPLANT
GAUZE SPONGE 4X4 12PLY STRL (GAUZE/BANDAGES/DRESSINGS) IMPLANT
GLOVE BIO SURGEON STRL SZ7 (GLOVE) ×2 IMPLANT
GLOVE BIO SURGEON STRL SZ8.5 (GLOVE) ×2 IMPLANT
GLOVE BIOGEL PI IND STRL 7.5 (GLOVE) ×1 IMPLANT
GLOVE BIOGEL PI IND STRL 8.5 (GLOVE) ×1 IMPLANT
GOWN SPEC L3 XXLG W/TWL (GOWN DISPOSABLE) ×1 IMPLANT
GOWN STRL REUS W/ TWL XL LVL3 (GOWN DISPOSABLE) ×1 IMPLANT
HOLDER FOLEY CATH W/STRAP (MISCELLANEOUS) IMPLANT
HOOD PEEL AWAY T7 (MISCELLANEOUS) ×3 IMPLANT
INSTR SCRW HEX REV FIX 3.5X48 (ORTHOPEDIC DISPOSABLE SUPPLIES) ×1 IMPLANT
INSTRUMENT SCRW HEX REV 3.5X48 (ORTHOPEDIC DISPOSABLE SUPPLIES) IMPLANT
KIT TURNOVER KIT A (KITS) IMPLANT
LINER TIB ASF PS GH/10-12 RT (Liner) IMPLANT
MANIFOLD NEPTUNE II (INSTRUMENTS) ×1 IMPLANT
MARKER SKIN DUAL TIP RULER LAB (MISCELLANEOUS) ×1 IMPLANT
NDL SPNL 18GX3.5 QUINCKE PK (NEEDLE) ×1 IMPLANT
NEEDLE SPNL 18GX3.5 QUINCKE PK (NEEDLE) ×1 IMPLANT
NS IRRIG 1000ML POUR BTL (IV SOLUTION) ×1 IMPLANT
PACK TOTAL KNEE CUSTOM (KITS) ×1 IMPLANT
PADDING CAST COTTON 6X4 STRL (CAST SUPPLIES) ×1 IMPLANT
PIN DRILL HDLS TROCAR 75 4PK (PIN) IMPLANT
PROTECTOR NERVE ULNAR (MISCELLANEOUS) ×1 IMPLANT
RESTRICTOR CEMENT SZ 5 C-STEM (Cement) IMPLANT
SAW OSC TIP CART 19.5X105X1.3 (SAW) ×1 IMPLANT
SCREW HEX HEADED 3.5X27 DISP (ORTHOPEDIC DISPOSABLE SUPPLIES) IMPLANT
SEALER BIPOLAR AQUA 6.0 (INSTRUMENTS) ×1 IMPLANT
SET HNDPC FAN SPRY TIP SCT (DISPOSABLE) ×1 IMPLANT
SET PAD KNEE POSITIONER (MISCELLANEOUS) ×1 IMPLANT
SOLUTION PRONTOSAN WOUND 350ML (IRRIGATION / IRRIGATOR) ×1 IMPLANT
SPIKE FLUID TRANSFER (MISCELLANEOUS) IMPLANT
SPONGE DRAIN TRACH 4X4 STRL 2S (GAUZE/BANDAGES/DRESSINGS) ×1 IMPLANT
STAPLER SKIN PROX 35W (STAPLE) IMPLANT
STEM EXR OFFSET PERS 22X135 (Stem) IMPLANT
STEM EXT KNEE PF OFF 17X135X3 (Stem) IMPLANT
SUT MNCRL AB 3-0 PS2 18 (SUTURE) ×1 IMPLANT
SUT MON AB 2-0 CT1 36 (SUTURE) ×2 IMPLANT
SUT STRATAFIX 14 PDO 48 VLT (SUTURE) ×1 IMPLANT
SUT STRATAFIX PDO 1 14 VIOLET (SUTURE) ×1 IMPLANT
SUT VIC AB 1 CTX36XBRD ANBCTR (SUTURE) ×2 IMPLANT
SUT VIC AB 2-0 CT1 TAPERPNT 27 (SUTURE) ×1 IMPLANT
TOWEL GREEN STERILE FF (TOWEL DISPOSABLE) ×1 IMPLANT
TOWER CARTRIDGE SMART MIX (DISPOSABLE) ×2 IMPLANT
TRAY FOLEY MTR SLVR 16FR STAT (SET/KITS/TRAYS/PACK) ×1 IMPLANT
TUBE SUCTION HIGH CAP CLEAR NV (SUCTIONS) ×1 IMPLANT
VDSETFEE IMPLANT
WATER STERILE IRR 1000ML POUR (IV SOLUTION) ×1 IMPLANT
WEDGE FEM DIST KNEE PS 11 5 (Joint) IMPLANT
WRAP KNEE MAXI GEL POST OP (GAUZE/BANDAGES/DRESSINGS) ×1 IMPLANT

## 2024-01-18 NOTE — Anesthesia Procedure Notes (Addendum)
 Procedure Name: Intubation Date/Time: 01/18/2024 12:39 PM  Performed by: Maurene Capes, CRNAPre-anesthesia Checklist: Patient identified, Emergency Drugs available, Suction available and Patient being monitored Patient Re-evaluated:Patient Re-evaluated prior to induction Oxygen Delivery Method: Circle System Utilized Preoxygenation: Pre-oxygenation with 100% oxygen Induction Type: IV induction Ventilation: Mask ventilation without difficulty Laryngoscope Size: Mac and 4 Tube type: Oral Tube size: 7.5 mm Number of attempts: 1 Airway Equipment and Method: Stylet and Oral airway Placement Confirmation: ETT inserted through vocal cords under direct vision, positive ETCO2 and breath sounds checked- equal and bilateral Secured at: 22 cm Tube secured with: Tape Dental Injury: Teeth and Oropharynx as per pre-operative assessment

## 2024-01-18 NOTE — Transfer of Care (Signed)
 Immediate Anesthesia Transfer of Care Note  Patient: Lysbeth Penner  Procedure(s) Performed: TOTAL KNEE REVISION (Right: Knee)  Patient Location: PACU  Anesthesia Type:GA combined with regional for post-op pain  Level of Consciousness: sedated  Airway & Oxygen Therapy: Patient Spontanous Breathing and Patient connected to face mask oxygen  Post-op Assessment: Report given to RN and Post -op Vital signs reviewed and stable  Post vital signs: Reviewed and stable  Last Vitals:  Vitals Value Taken Time  BP 106/66 01/18/24 1652  Temp    Pulse 62 01/18/24 1655  Resp 33 01/18/24 1655  SpO2 98 % 01/18/24 1655  Vitals shown include unfiled device data.  Last Pain:  Vitals:   01/18/24 1100  TempSrc:   PainSc: 0-No pain         Complications: No notable events documented.

## 2024-01-18 NOTE — Interval H&P Note (Signed)
 History and Physical Interval Note:  01/18/2024 11:15 AM  Javier Lawson  has presented today for surgery, with the diagnosis of failed right total knee arthroplasty.  The various methods of treatment have been discussed with the patient and family. After consideration of risks, benefits and other options for treatment, the patient has consented to  Procedure(s): TOTAL KNEE REVISION (Right) as a surgical intervention.  The patient's history has been reviewed, patient examined, no change in status, stable for surgery.  I have reviewed the patient's chart and labs.  Questions were answered to the patient's satisfaction.    The risks, benefits, and alternatives were discussed with the patient. There are risks associated with the surgery including, but not limited to, problems with anesthesia (death), infection, instability (giving out of the joint), dislocation, differences in leg length/angulation/rotation, fracture of bones, loosening or failure of implants, hematoma (blood accumulation) which may require surgical drainage, blood clots, pulmonary embolism, nerve injury (foot drop and lateral thigh numbness), and blood vessel injury. The patient understands these risks and elects to proceed.    Javier Lawson

## 2024-01-18 NOTE — Anesthesia Procedure Notes (Signed)
 Anesthesia Regional Block: Adductor canal block   Pre-Anesthetic Checklist: , timeout performed,  Correct Patient, Correct Site, Correct Laterality,  Correct Procedure, Correct Position, site marked,  Risks and benefits discussed,  Surgical consent,  Pre-op evaluation,  At surgeon's request and post-op pain management  Laterality: Right  Prep: chloraprep       Needles:  Injection technique: Single-shot  Needle Type: Echogenic Needle     Needle Length: 9cm  Needle Gauge: 21     Additional Needles:   Procedures:,,,, ultrasound used (permanent image in chart),,    Narrative:  Start time: 01/18/2024 10:53 AM End time: 01/18/2024 10:58 AM Injection made incrementally with aspirations every 5 mL.  Performed by: Personally  Anesthesiologist: Marcene Duos, MD

## 2024-01-18 NOTE — Plan of Care (Signed)
   Problem: Education: Goal: Knowledge of General Education information will improve Description Including pain rating scale, medication(s)/side effects and non-pharmacologic comfort measures Outcome: Progressing   Problem: Activity: Goal: Risk for activity intolerance will decrease Outcome: Progressing   Problem: Safety: Goal: Ability to remain free from injury will improve Outcome: Progressing

## 2024-01-18 NOTE — Discharge Instructions (Signed)

## 2024-01-18 NOTE — Anesthesia Postprocedure Evaluation (Signed)
 Anesthesia Post Note  Patient: Quentyn Kolbeck Taniguchi  Procedure(s) Performed: TOTAL KNEE REVISION (Right: Knee)     Patient location during evaluation: PACU Anesthesia Type: General Level of consciousness: awake and alert Pain management: pain level controlled Vital Signs Assessment: post-procedure vital signs reviewed and stable Respiratory status: spontaneous breathing, nonlabored ventilation, respiratory function stable and patient connected to nasal cannula oxygen Cardiovascular status: blood pressure returned to baseline and stable Postop Assessment: no apparent nausea or vomiting Anesthetic complications: no  No notable events documented.  Last Vitals:  Vitals:   01/18/24 1815 01/18/24 1839  BP: 101/60 109/60  Pulse: (!) 48 (!) 50  Resp: 14 14  Temp:  36.7 C  SpO2: 92% 96%    Last Pain:  Vitals:   01/18/24 1935  TempSrc:   PainSc: 8                  Trevor Iha

## 2024-01-18 NOTE — Op Note (Signed)
 OPERATIVE REPORT  SURGEON: Samson Frederic, MD   ASSISTANT: Clint Bolder, PA-C  PREOPERATIVE DIAGNOSIS: Failed Right right total knee arthroplasty.   POSTOPERATIVE DIAGNOSIS: Failed Right right total knee arthroplasty.    PROCEDURE: Revision right total knee arthroplasty, femoral and tibial components.  EXPLANTS: DePuy attune total knee system including size 9 PS femur, size 9 RP tibia, and a 5 mm RP PS poly.  IMPLANTS: Zimmer Persona revision femoral component size 11+ with 5 mm distal augment x 2 and 22 x 135 mm splined stem, 3 mm offset. Persona revision tibial baseplate size G with 17 x 135 mm splined stem, 3 mm offset. Revision tibia trabecular metal central cone, size medium. Vivacit-E polyethelyene insert, size 10 mm, CPS. Refobacin bone cement with gentamicin.  ANESTHESIA:  MAC, Regional, and Spinal  TOURNIQUET TIME: Not utilized.   ESTIMATED BLOOD LOSS:-400 mL    ANTIBIOTICS: 2 g Ancef.  SPECIMENS: Tibial interface membrane and bone for aerobic and anaerobic tissue culture.  DRAINS: None.  COMPLICATIONS: None   CONDITION: PACU - hemodynamically stable.   BRIEF CLINICAL NOTE: Javier Lawson is a 63 y.o. male who underwent primary right total knee arthroplasty by Dr. Thomasena Edis on 02/07/2020.  Patient has had ongoing pain and mid flexion instability of the knee.  I saw him in the office as a second opinion.  Workup included inflammatory markers, which were both normal.  He also had a triple phase bone scan that did not demonstrate any increased uptake to suggest loosening.  He had a CT scan demonstrating internal rotation of the femoral and tibial components with a small effusion. After failing conservative management, the patient was indicated for revision total knee arthroplasty. The risks, benefits, and alternatives to the procedure were explained, and the patient elected to proceed.  PROCEDURE IN DETAIL: Adductor canal block was obtained in the pre-op holding area. Once  inside the operative room, spinal anesthesia was obtained, and a foley catheter was inserted. The patient was then positioned and the lower extremity was prepped and draped in the normal sterile surgical fashion.  A time-out was called verifying side and site of surgery. The patient received IV antibiotics within 60 minutes of beginning the procedure. A tourniquet was not utilized.   The previous anterior incision was sharply excised with a #10 blade.  Full-thickness skin flaps were created.  An anterior approach to the knee was performed utilizing a medial parapatellar arthrotomy.  Joint fluid was normal in appearance.  A medial release was performed and the infrapatellar scar was excised.  The knee was brought into extension, and a radical synovectomy was performed of the medial gutter, lateral gutter, and suprapatellar pouch.  I sharply excised all scar around the patellar component using Bovie electrocautery.  The patellar component was stable without any wear.  The femoral and tibial components were inspected, and there was no gross loosening.  An ACL saw blade was used to disrupt the implant cement interface of the femoral and tibial components.  This interface was further disrupted using the versa drive pneumatic osteotome.  A straight osteotome was used to amputate the RP peg of the poly liner.  The poly liner was removed.  Shukla femoral extractor was used to remove the femoral component without any significant bone loss.  Shukla tibial extractor was used to remove the tibial component without any significant bone loss.  There was no debonding of the cement mantle from his implants.  I sent a routine tissue culture of the tibial interface  membrane/proximal tibia bone.  The tibial cement mantle was disrupted with Moreland osteotomes and removed.  Entry drill was used to gain access to the femoral and tibial intramedullary canals.  I sequentially reamed up to a 17 mm reamer on the tibia.   Using an  intramedullary cutting guide, I freshened the proximal tibia cut.The tibia was sized to a G, and 3 mm of offset was used to maximize coverage.  The rotation was set.  The proximal tibial surface was prepared.  I prepared the proximal tibia for a medium cone.  The trial cone and trial tibial components were inserted.  I sequentially reamed up to a 22 mm reamer on the femur.  Using an intramedullary guide, the distal femoral cut was freshened.  The femur was sized to an 11+.  To optimize coverage, 3 mm of offset was used to anteriorize the femoral component.  The distal femoral cutting block was pinned into place with the flexion gap tensioned, in order to create a rectangular extension gap.  The trial femoral component was assembled on the back table and inserted.  I placed a 10 mm trial poly.  The knee was brought through a range of motion.  I removed the trial femoral component and added 5 mm distal augments x 2.  10 mm trial poly was replaced.  The knee was brought through a range of motion.  The flexion and extension gaps were perfectly balanced.  The knee was well-balanced to varus and valgus stress throughout the range of motion.  The patella tracks centrally using a no thumbs technique.  The trial components were removed.  The cut bony surfaces were irrigated with pulsatile lavage.  The bony surfaces were dried with lap sponges.  The real final components were opened and assembled on the back table.  The tibial cone was placed.  The tibial and femoral components were cemented using a hybrid fixation technique.  Trial poly liner was placed, and the knee was brought into extension while the cement polymerized.  All excess cement was cleared. The knee was tested for a final time and found to be well balanced.  The trial liner was exchanged for the real liner.   The wound was copiously irrigated with Prontosan solution and normal saline using pulse lavage.  The wound was closed in layers using #1 Vicryl and  Stratafix for the fascia, 2-0 Vicryl for the subcutaneous fat, 2-0 Monocryl for the deep dermal layer, and staples plus Dermabond for the skin.  Once the glue was fully dried, an Aquacell Ag and compressive dressing were applied.  The patient was transported to the recovery room in stable condition.  Sponge, needle, and instrument counts were correct at the end of the case x2.  The patient tolerated the procedure well and there were no known complications.  The aquamantis was utilized for this case to help facilitate better hemostasis as patient was felt to be at increased risk of bleeding because of complex case requiring increased OR time and/or exposure.  -minimally invasive approach.  A oscillating saw tip was utilized for this case to prevent damage to the soft tissue structures such as muscles, ligaments and tendons, and to ensure accurate bone cuts. This patient was at increased risk for above structures due to  minimally invasive approach.  Please note that a surgical assistant was a medical necessity for this procedure in order to perform it in a safe and expeditious manner. Surgical assistant was necessary to retract the ligaments and  vital neurovascular structures to prevent injury to them and also necessary for proper positioning of the limb to allow for anatomic placement of the prosthesis.

## 2024-01-19 ENCOUNTER — Other Ambulatory Visit: Payer: Self-pay

## 2024-01-19 LAB — CBC
HCT: 36.5 % — ABNORMAL LOW (ref 39.0–52.0)
Hemoglobin: 12.3 g/dL — ABNORMAL LOW (ref 13.0–17.0)
MCH: 30 pg (ref 26.0–34.0)
MCHC: 33.7 g/dL (ref 30.0–36.0)
MCV: 89 fL (ref 80.0–100.0)
Platelets: 224 10*3/uL (ref 150–400)
RBC: 4.1 MIL/uL — ABNORMAL LOW (ref 4.22–5.81)
RDW: 13.2 % (ref 11.5–15.5)
WBC: 10.3 10*3/uL (ref 4.0–10.5)
nRBC: 0 % (ref 0.0–0.2)

## 2024-01-19 LAB — BASIC METABOLIC PANEL
Anion gap: 6 (ref 5–15)
BUN: 21 mg/dL (ref 8–23)
CO2: 22 mmol/L (ref 22–32)
Calcium: 8.4 mg/dL — ABNORMAL LOW (ref 8.9–10.3)
Chloride: 105 mmol/L (ref 98–111)
Creatinine, Ser: 1.11 mg/dL (ref 0.61–1.24)
GFR, Estimated: >60 mL/min (ref >60)
Glucose, Bld: 147 mg/dL — ABNORMAL HIGH (ref 70–99)
Potassium: 4.5 mmol/L (ref 3.5–5.1)
Sodium: 133 mmol/L — ABNORMAL LOW (ref 135–145)

## 2024-01-19 NOTE — Progress Notes (Signed)
    Subjective:  Patient reports pain as moderate.  Denies N/V/CP/SOB/Abd pain. He reports difficulty with pain control yesterday/overnight. He reports some improvement this morning.  Denies any tingling or numbness in LE bilaterally.  Nurse removing catheter this morning.   Objective:   VITALS:   Vitals:   01/18/24 1839 01/18/24 2044 01/19/24 0133 01/19/24 0549  BP: 109/60 104/67 117/69 117/69  Pulse: (!) 50 66 71 75  Resp: 14 16 16 16   Temp: 98 F (36.7 C) 97.9 F (36.6 C) 98.5 F (36.9 C) 98.7 F (37.1 C)  TempSrc: Oral Oral Oral Oral  SpO2: 96% 97% 96% 97%  Weight: 111.8 kg     Height: 6\' 2"  (1.88 m)       NAD Neurologically intact ABD soft Neurovascular intact Sensation intact distally Intact pulses distally Dorsiflexion/Plantar flexion intact Incision: dressing C/D/I No cellulitis present Compartment soft   Lab Results  Component Value Date   WBC 10.3 01/19/2024   HGB 12.3 (L) 01/19/2024   HCT 36.5 (L) 01/19/2024   MCV 89.0 01/19/2024   PLT 224 01/19/2024   BMET    Component Value Date/Time   NA 133 (L) 01/19/2024 0334   K 4.5 01/19/2024 0334   CL 105 01/19/2024 0334   CO2 22 01/19/2024 0334   GLUCOSE 147 (H) 01/19/2024 0334   BUN 21 01/19/2024 0334   CREATININE 1.11 01/19/2024 0334   CALCIUM 8.4 (L) 01/19/2024 0334   GFRNONAA >60 01/19/2024 0334     Assessment/Plan: 1 Day Post-Op   Principal Problem:   Failed total knee, right, initial encounter (HCC) Active Problems:   S/P revision of total knee, right  ABLA. Hemoglobin 12.3. Continue to monitor.  UA negative.   WBAT with walker DVT ppx: Aspirin, SCDs, TEDS PO pain control PT/OT: To come today.  Dispo:  Plan to d/c home with OPPT pending PT progression, voiding, and pain control today vs tomorrow.    Clois Dupes 01/19/2024, 7:35 AM   [facility]  [place] [address], [address], [place], [place] [address] Phone: [phone] [email or web address] Facebook   [employer]

## 2024-01-19 NOTE — Progress Notes (Signed)
 Physical Therapy Treatment Patient Details Name: Javier Lawson MRN: [id number] DOB: December 19, 1960 Today's Date: 01/19/2024   History of Present Illness Pt s/p R TKR revision and with hx of R TKR ~2021    PT Comments  Pt s/p R TKR revision and presenting with decreased R LE strength/ROM and post op pain limiting functional mobility.  Pt should progress to dc home with family assist and with first OP PT scheduled 01/23/24.    If plan is discharge home, recommend the following: A little help with walking and/or transfers;A little help with bathing/dressing/bathroom;Assistance with cooking/housework;Assist for transportation;Help with stairs or ramp for entrance   Can travel by private vehicle        Equipment Recommendations   (Pt reports RW being delivered to house)    Recommendations for Other Services       Precautions / Restrictions Precautions Precautions: Knee;Fall Restrictions Weight Bearing Restrictions Per Provider Order: No Other Position/Activity Restrictions: WBAT     Mobility  Bed Mobility Overal bed mobility: Needs Assistance Bed Mobility: Supine to Sit     Supine to sit: Min assist     General bed mobility comments: cues for sequence and use of L LE to self assist    Transfers Overall transfer level: Needs assistance Equipment used: Rolling walker (2 wheels) Transfers: Sit to/from Stand Sit to Stand: Min assist, From elevated surface           General transfer comment: cues for LE management and use of UEs to self assist    Ambulation/Gait Ambulation/Gait assistance: Min assist Gait Distance (Feet): 34 Feet Assistive device: Rolling walker (2 wheels) Gait Pattern/deviations: Step-to pattern, Decreased step length - right, Decreased step length - left, Shuffle, Antalgic, Trunk flexed Gait velocity: decr     General Gait Details: cues for sequence, posture and position from RW; distance ltd by pain and c/o lightheadeness   Stairs              Wheelchair Mobility     Tilt Bed    Modified Rankin (Stroke Patients Only)       Balance Overall balance assessment: Needs assistance Sitting-balance support: No upper extremity supported, Feet supported Sitting balance-Leahy Scale: Good     Standing balance support: Bilateral upper extremity supported Standing balance-Leahy Scale: Poor                              Communication Communication Communication: No apparent difficulties  Cognition Arousal: Alert Behavior During Therapy: WFL for tasks assessed/performed   PT - Cognitive impairments: No apparent impairments                         Following commands: Intact      Cueing Cueing Techniques: Verbal cues  Exercises Total Joint Exercises Ankle Circles/Pumps: AROM, Both, 15 reps, Supine Quad Sets: AROM, Both, 10 reps, Supine Heel Slides: AAROM, Right, Supine, 15 reps Straight Leg Raises: AAROM, Right, 10 reps, Supine    General Comments        Pertinent Vitals/Pain Pain Assessment Pain Assessment: 0-10 Pain Score: 8  Pain Location: R knee Pain Descriptors / Indicators: Aching, Sore Pain Intervention(s): Limited activity within patient's tolerance, Premedicated before session, Monitored during session, Ice applied    Home Living Family/patient expects to be discharged to:: Private residence Living Arrangements: Spouse/significant other;Children Available Help at Discharge: Family;Available 24 hours/day Type of Home: House Home Access: Stairs to  enter Entrance Stairs-Rails: Right;Left Entrance Stairs-Number of Steps: 6   Home Layout: One level Home Equipment: Crutches (Pt reports RW and iceman being delivered to home but has not arrived as of this am)      Prior Function            PT Goals (current goals can now be found in the care plan section) Acute Rehab PT Goals Patient Stated Goal: REgain IND PT Goal Formulation: With patient Time For Goal Achievement:  01/26/24 Potential to Achieve Goals: Good    Frequency    7X/week      PT Plan      Co-evaluation              AM-PAC PT "6 Clicks" Mobility   Outcome Measure  Help needed turning from your back to your side while in a flat bed without using bedrails?: A Little Help needed moving from lying on your back to sitting on the side of a flat bed without using bedrails?: A Little Help needed moving to and from a bed to a chair (including a wheelchair)?: A Little Help needed standing up from a chair using your arms (e.g., wheelchair or bedside chair)?: A Little Help needed to walk in hospital room?: A Little Help needed climbing 3-5 steps with a railing? : A Lot 6 Click Score: 17    End of Session Equipment Utilized During Treatment: Gait belt Activity Tolerance: Patient tolerated treatment well;Patient limited by pain Patient left: in chair;with call bell/phone within reach;with chair alarm set;with family/visitor present Nurse Communication: Mobility status PT Visit Diagnosis: Unsteadiness on feet (R26.81);Difficulty in walking, not elsewhere classified (R26.2)     Time: 0865-7846 PT Time Calculation (min) (ACUTE ONLY): 30 min  Charges:    $Therapeutic Exercise: 8-22 mins PT General Charges $$ ACUTE PT VISIT: 1 Visit                     Mauro Kaufmann PT Acute Rehabilitation Services Pager [phone] Office [phone] ]   Mauro Kaufmann 01/19/2024, 12:21 PM

## 2024-01-19 NOTE — Progress Notes (Signed)
 Physical Therapy Treatment Patient Details Name: Javier Lawson MRN: [id number] DOB: 1960-12-24 Today's Date: 01/19/2024   History of Present Illness Pt s/p R TKR revision and with hx of R TKR ~2021    PT Comments  Pt continues cooperative and progressing well with mobility - up to ambulate increased distance in hall and with noted improvement in stability and with no c/o lightheadedness.  Pt hopeful for dc home tomorrow.    If plan is discharge home, recommend the following: A little help with walking and/or transfers;A little help with bathing/dressing/bathroom;Assistance with cooking/housework;Assist for transportation;Help with stairs or ramp for entrance   Can travel by private vehicle        Equipment Recommendations   (Pt reports RW being delivered to house)    Recommendations for Other Services       Precautions / Restrictions Precautions Precautions: Knee;Fall Restrictions Weight Bearing Restrictions Per Provider Order: No Other Position/Activity Restrictions: WBAT     Mobility  Bed Mobility Overal bed mobility: Needs Assistance Bed Mobility: Sit to Supine     Supine to sit: Min assist Sit to supine: Min assist   General bed mobility comments: cues for sequence and use of L LE to self assist    Transfers Overall transfer level: Needs assistance Equipment used: Rolling walker (2 wheels) Transfers: Sit to/from Stand Sit to Stand: Min assist, Contact guard assist           General transfer comment: cues for LE management and use of UEs to self assist    Ambulation/Gait Ambulation/Gait assistance: Min assist, Contact guard assist Gait Distance (Feet): 70 Feet (twice) Assistive device: Rolling walker (2 wheels) Gait Pattern/deviations: Step-to pattern, Decreased step length - right, Decreased step length - left, Shuffle, Antalgic, Trunk flexed Gait velocity: decr     General Gait Details: cues for sequence, posture and position from RW; distance ltd  by pain and c/o lightheadeness   Stairs             Wheelchair Mobility     Tilt Bed    Modified Rankin (Stroke Patients Only)       Balance Overall balance assessment: Needs assistance Sitting-balance support: No upper extremity supported, Feet supported Sitting balance-Leahy Scale: Good     Standing balance support: No upper extremity supported Standing balance-Leahy Scale: Fair                              Hotel manager: No apparent difficulties  Cognition Arousal: Alert Behavior During Therapy: WFL for tasks assessed/performed   PT - Cognitive impairments: No apparent impairments                         Following commands: Intact      Cueing Cueing Techniques: Verbal cues  Exercises Total Joint Exercises Ankle Circles/Pumps: AROM, Both, 15 reps, Supine Quad Sets: AROM, Both, 10 reps, Supine Heel Slides: AAROM, Right, Supine, 15 reps Straight Leg Raises: AAROM, Right, 10 reps, Supine    General Comments        Pertinent Vitals/Pain Pain Assessment Pain Assessment: 0-10 Pain Score: 5  Pain Location: R knee Pain Descriptors / Indicators: Aching, Sore Pain Intervention(s): Limited activity within patient's tolerance, Monitored during session, Premedicated before session, Ice applied    Home Living Family/patient expects to be discharged to:: Private residence Living Arrangements: Spouse/significant other;Children Available Help at Discharge: Family;Available 24 hours/day Type of Home:  House Home Access: Stairs to enter Entrance Stairs-Rails: Doctor, general practice of Steps: 6   Home Layout: One level Home Equipment: Crutches (Pt reports RW and iceman being delivered to home but has not arrived as of this am)      Prior Function            PT Goals (current goals can now be found in the care plan section) Acute Rehab PT Goals Patient Stated Goal: REgain IND PT Goal  Formulation: With patient Time For Goal Achievement: 01/26/24 Potential to Achieve Goals: Good Progress towards PT goals: Progressing toward goals    Frequency    7X/week      PT Plan      Co-evaluation              AM-PAC PT "6 Clicks" Mobility   Outcome Measure  Help needed turning from your back to your side while in a flat bed without using bedrails?: A Little Help needed moving from lying on your back to sitting on the side of a flat bed without using bedrails?: A Little Help needed moving to and from a bed to a chair (including a wheelchair)?: A Little Help needed standing up from a chair using your arms (e.g., wheelchair or bedside chair)?: A Little Help needed to walk in hospital room?: A Little Help needed climbing 3-5 steps with a railing? : A Lot 6 Click Score: 17    End of Session Equipment Utilized During Treatment: Gait belt Activity Tolerance: Patient tolerated treatment well;Patient limited by pain Patient left: in bed;with call bell/phone within reach;with bed alarm set Nurse Communication: Mobility status PT Visit Diagnosis: Unsteadiness on feet (R26.81);Difficulty in walking, not elsewhere classified (R26.2)     Time: 1610-9604 PT Time Calculation (min) (ACUTE ONLY): 21 min  Charges:    $Gait Training: 8-22 mins $Therapeutic Exercise: 8-22 mins PT General Charges $$ ACUTE PT VISIT: 1 Visit                     Mauro Kaufmann PT Acute Rehabilitation Services Pager [phone] Office [phone]    Denissa Cozart 01/19/2024, 3:53 PM

## 2024-01-19 NOTE — Plan of Care (Signed)
  Problem: Education: Goal: Knowledge of General Education information will improve Description: Including pain rating scale, medication(s)/side effects and non-pharmacologic comfort measures 01/19/2024 0716 by Beverly Sessions, RN Outcome: Progressing 01/18/2024 1834 by Beverly Sessions, RN Outcome: Progressing   Problem: Activity: Goal: Risk for activity intolerance will decrease 01/19/2024 0716 by Beverly Sessions, RN Outcome: Progressing 01/18/2024 1834 by Beverly Sessions, RN Outcome: Progressing   Problem: Pain Managment: Goal: General experience of comfort will improve and/or be controlled 01/19/2024 0716 by Beverly Sessions, RN Outcome: Progressing 01/18/2024 1834 by Beverly Sessions, RN Outcome: Progressing

## 2024-01-19 NOTE — TOC Transition Note (Signed)
 Transition of Care [facility]) - Discharge Note   Patient Details  Name: Javier Lawson MRN: [id number] Date of Birth: May 20, 1961  Transition of [facility]) CM/SW Contact:  Howell Rucks, RN Phone Number: 01/19/2024, 9:29 AM   Clinical Narrative: Met with pt to review dc therapy and home DME needs, pt confirmed OPPT, has an appt, reports his workers comp has ordered a RW to be delivered to his home, no home DME needs. No TOC needs.       Final next level of care: OP Rehab Barriers to Discharge: No Barriers Identified   Patient Goals and CMS Choice Patient states their goals for this hospitalization and ongoing recovery are:: return home          Discharge Placement                       Discharge Plan and Services Additional resources added to the After Visit Summary for                                       Social Drivers of Health (SDOH) Interventions SDOH Screenings   Food Insecurity: Patient Declined (01/18/2024)  Housing: Patient Declined (01/18/2024)  Transportation Needs: Patient Declined (01/18/2024)  Utilities: Patient Declined (01/18/2024)  Social Connections: Patient Declined (01/18/2024)  Tobacco Use: Low Risk  (01/18/2024)     Readmission Risk Interventions    01/19/2024    9:29 AM  Readmission Risk Prevention Plan  Post Dischage Appt Complete  Medication Screening Complete  Transportation Screening Complete

## 2024-01-20 LAB — BASIC METABOLIC PANEL
Anion gap: 7 (ref 5–15)
BUN: 18 mg/dL (ref 8–23)
CO2: 24 mmol/L (ref 22–32)
Calcium: 8.4 mg/dL — ABNORMAL LOW (ref 8.9–10.3)
Chloride: 103 mmol/L (ref 98–111)
Creatinine, Ser: 1.05 mg/dL (ref 0.61–1.24)
GFR, Estimated: >60 mL/min (ref >60)
Glucose, Bld: 115 mg/dL — ABNORMAL HIGH (ref 70–99)
Potassium: 4 mmol/L (ref 3.5–5.1)
Sodium: 134 mmol/L — ABNORMAL LOW (ref 135–145)

## 2024-01-20 LAB — CBC
HCT: 31 % — ABNORMAL LOW (ref 39.0–52.0)
Hemoglobin: 10.6 g/dL — ABNORMAL LOW (ref 13.0–17.0)
MCH: 29.9 pg (ref 26.0–34.0)
MCHC: 34.2 g/dL (ref 30.0–36.0)
MCV: 87.3 fL (ref 80.0–100.0)
Platelets: 190 10*3/uL (ref 150–400)
RBC: 3.55 MIL/uL — ABNORMAL LOW (ref 4.22–5.81)
RDW: 13.4 % (ref 11.5–15.5)
WBC: 7.4 10*3/uL (ref 4.0–10.5)
nRBC: 0 % (ref 0.0–0.2)

## 2024-01-20 MED ORDER — METHOCARBAMOL 500 MG PO TABS: 500.0000 mg | ORAL_TABLET | Freq: Four times a day (QID) | ORAL | 0 refills | Status: AC | PRN

## 2024-01-20 MED ORDER — ONDANSETRON HCL 4 MG PO TABS: 4.0000 mg | ORAL_TABLET | Freq: Three times a day (TID) | ORAL | 0 refills | Status: AC | PRN

## 2024-01-20 MED ORDER — OXYCODONE HCL 5 MG PO TABS: 5.0000 mg | ORAL_TABLET | ORAL | 0 refills | Status: AC | PRN

## 2024-01-20 MED ORDER — DOCUSATE SODIUM 100 MG PO CAPS: 100.0000 mg | ORAL_CAPSULE | Freq: Two times a day (BID) | ORAL | 0 refills | Status: AC

## 2024-01-20 MED ORDER — ASPIRIN 81 MG PO CHEW: 81.0000 mg | CHEWABLE_TABLET | Freq: Two times a day (BID) | ORAL | 0 refills | Status: AC

## 2024-01-20 MED ORDER — POLYETHYLENE GLYCOL 3350 17 G PO PACK: 17.0000 g | PACK | Freq: Every day | ORAL | 0 refills | Status: AC | PRN

## 2024-01-20 MED ORDER — ACETAMINOPHEN 500 MG PO TABS: 1000.0000 mg | ORAL_TABLET | Freq: Three times a day (TID) | ORAL | 0 refills | Status: AC | PRN

## 2024-01-20 MED ORDER — IBUPROFEN 800 MG PO TABS: 800.0000 mg | ORAL_TABLET | Freq: Three times a day (TID) | ORAL | 1 refills | Status: AC | PRN

## 2024-01-20 MED ORDER — SENNA 8.6 MG PO TABS: 2.0000 | ORAL_TABLET | Freq: Every day | ORAL | 0 refills | Status: AC

## 2024-01-20 NOTE — Discharge Summary (Signed)
 Physician Discharge Summary  Patient ID: Javier Lawson MRN: [id number] DOB/AGE: 1961-05-16 63 y.o.  Admit date: 01/18/2024 Discharge date: 01/20/2024  Admission Diagnoses:  Failed total knee, right, initial encounter [facility])  Discharge Diagnoses:  Principal Problem:   Failed total knee, right, initial encounter (HCC) Active Problems:   S/P revision of total knee, right   Past Medical History:  Diagnosis Date   Arthritis    Bradycardia    Pain in both knees    c/o "aching"- no major problem    Surgeries: Procedure(s): TOTAL KNEE REVISION on 01/18/2024   Consultants (if any):   Discharged Condition: Improved  Hospital Course: Javier Lawson is an 63 y.o. male who was admitted 01/18/2024 with a diagnosis of Failed total knee, right, initial encounter (HCC) and went to the operating room on 01/18/2024 and underwent the above named procedures.    He was given perioperative antibiotics:  Anti-infectives (From admission, onward)    Start     Dose/Rate Route Frequency Ordered Stop   01/18/24 2230  ceFAZolin (ANCEF) IVPB 2g/100 mL premix        2 g 200 mL/hr over 30 Minutes Intravenous Every 6 hours 01/18/24 1830 01/19/24 0450   01/18/24 0900  ceFAZolin (ANCEF) IVPB 2g/100 mL premix        2 g 200 mL/hr over 30 Minutes Intravenous On call to O.R. 01/18/24 0858 01/18/24 1625       He was given sequential compression devices, early ambulation, and aspirin for DVT prophylaxis.  POD#1 Patient ambulated 34 and 70 feet. Difficulty with pain control.  POD#2 Patient ambulated 100 feet with PT. Pain improved. D/c home with OPPT. Follow-up in office in 2 weeks.   He benefited maximally from the hospital stay and there were no complications.    Recent vital signs:  Vitals:   01/20/24 0836 01/20/24 1017  BP: 132/66 (!) 141/77  Pulse: 82 74  Resp:    Temp: 99.3 F (37.4 C)   SpO2: 95%     Recent laboratory studies:  Lab Results  Component Value Date   HGB 10.6 (L) 01/20/2024    HGB 12.3 (L) 01/19/2024   HGB 15.4 01/09/2024   Lab Results  Component Value Date   WBC 7.4 01/20/2024   PLT 190 01/20/2024   No results found for: "INR" Lab Results  Component Value Date   NA 134 (L) 01/20/2024   K 4.0 01/20/2024   CL 103 01/20/2024   CO2 24 01/20/2024   BUN 18 01/20/2024   CREATININE 1.05 01/20/2024   GLUCOSE 115 (H) 01/20/2024     Allergies as of 01/20/2024   No Known Allergies      Medication List     TAKE these medications    acetaminophen 500 MG tablet Commonly known as: TYLENOL Take 2 tablets (1,000 mg total) by mouth every 8 (eight) hours as needed for moderate pain (pain score 4-6), mild pain (pain score 1-3), fever or headache. What changed:  how much to take when to take this reasons to take this   aspirin 81 MG chewable tablet Commonly known as: Aspirin Childrens Chew 1 tablet (81 mg total) by mouth 2 (two) times daily with a meal.   diphenhydramine-acetaminophen 25-500 MG Tabs tablet Commonly known as: TYLENOL PM Take 1 tablet by mouth at bedtime as needed (sleep).   docusate sodium 100 MG capsule Commonly known as: Colace Take 1 capsule (100 mg total) by mouth 2 (two) times daily.   ibuprofen 800  MG tablet Commonly known as: ADVIL Take 1 tablet (800 mg total) by mouth every 8 (eight) hours as needed.   methocarbamol 500 MG tablet Commonly known as: ROBAXIN Take 1 tablet (500 mg total) by mouth every 6 (six) hours as needed.   ondansetron 4 MG tablet Commonly known as: Zofran Take 1 tablet (4 mg total) by mouth every 8 (eight) hours as needed for nausea or vomiting.   oxyCODONE 5 MG immediate release tablet Commonly known as: Roxicodone Take 1-2 tablets (5-10 mg total) by mouth every 4 (four) hours as needed for severe pain (pain score 7-10).   polyethylene glycol 17 g packet Commonly known as: MiraLax Take 17 g by mouth daily as needed for mild constipation or moderate constipation.   senna 8.6 MG Tabs  tablet Commonly known as: SENOKOT Take 2 tablets (17.2 mg total) by mouth at bedtime for 15 days.   vitamin C 1000 MG tablet Take 1,000 mg by mouth daily.               Discharge Care Instructions  (From admission, onward)           Start     Ordered   01/20/24 0000  Weight bearing as tolerated        01/20/24 1005   01/20/24 0000  Change dressing       Comments: Do not remove your dressing.   01/20/24 1005              WEIGHT BEARING   Weight bearing as tolerated with assist device (walker, cane, etc) as directed, use it as long as suggested by your surgeon or therapist, typically at least 4-6 weeks.   EXERCISES  Results after joint replacement surgery are often greatly improved when you follow the exercise, range of motion and muscle strengthening exercises prescribed by your doctor. Safety measures are also important to protect the joint from further injury. Any time any of these exercises cause you to have increased pain or swelling, decrease what you are doing until you are comfortable again and then slowly increase them. If you have problems or questions, call your caregiver or physical therapist for advice.   Rehabilitation is important following a joint replacement. After just a few days of immobilization, the muscles of the leg can become weakened and shrink (atrophy).  These exercises are designed to build up the tone and strength of the thigh and leg muscles and to improve motion. Often times heat used for twenty to thirty minutes before working out will loosen up your tissues and help with improving the range of motion but do not use heat for the first two weeks following surgery (sometimes heat can increase post-operative swelling).   These exercises can be done on a training (exercise) mat, on the floor, on a table or on a bed. Use whatever works the best and is most comfortable for you.    Use music or television while you are exercising so that the  exercises are a pleasant break in your day. This will make your life better with the exercises acting as a break in your routine that you can look forward to.   Perform all exercises about fifteen times, three times per day or as directed.  You should exercise both the operative leg and the other leg as well.  Exercises include:   Quad Sets - Tighten up the muscle on the front of the thigh (Quad) and hold for 5-10 seconds.   Straight  Leg Raises - With your knee straight (if you were given a brace, keep it on), lift the leg to 60 degrees, hold for 3 seconds, and slowly lower the leg.  Perform this exercise against resistance later as your leg gets stronger.  Leg Slides: Lying on your back, slowly slide your foot toward your buttocks, bending your knee up off the floor (only go as far as is comfortable). Then slowly slide your foot back down until your leg is flat on the floor again.  Angel Wings: Lying on your back spread your legs to the side as far apart as you can without causing discomfort.  Hamstring Strength:  Lying on your back, push your heel against the floor with your leg straight by tightening up the muscles of your buttocks.  Repeat, but this time bend your knee to a comfortable angle, and push your heel against the floor.  You may put a pillow under the heel to make it more comfortable if necessary.   A rehabilitation program following joint replacement surgery can speed recovery and prevent re-injury in the future due to weakened muscles. Contact your doctor or a physical therapist for more information on knee rehabilitation.    CONSTIPATION  Constipation is defined medically as fewer than three stools per week and severe constipation as less than one stool per week.  Even if you have a regular bowel pattern at home, your normal regimen is likely to be disrupted due to multiple reasons following surgery.  Combination of anesthesia, postoperative narcotics, change in appetite and fluid  intake all can affect your bowels.   YOU MUST use at least one of the following options; they are listed in order of increasing strength to get the job done.  They are all available over the counter, and you may need to use some, POSSIBLY even all of these options:    Drink plenty of fluids (prune juice may be helpful) and high fiber foods Colace 100 mg by mouth twice a day  Senokot for constipation as directed and as needed Dulcolax (bisacodyl), take with full glass of water  Miralax (polyethylene glycol) once or twice a day as needed.  If you have tried all these things and are unable to have a bowel movement in the first 3-4 days after surgery call either your surgeon or your primary doctor.    If you experience loose stools or diarrhea, hold the medications until you stool forms back up.  If your symptoms do not get better within 1 week or if they get worse, check with your doctor.  If you experience "the worst abdominal pain ever" or develop nausea or vomiting, please contact the office immediately for further recommendations for treatment.   ITCHING:  If you experience itching with your medications, try taking only a single pain pill, or even half a pain pill at a time.  You can also use Benadryl over the counter for itching or also to help with sleep.   TED HOSE STOCKINGS:  Use stockings on both legs until for at least 2 weeks or as directed by physician office. They may be removed at night for sleeping.  MEDICATIONS:  See your medication summary on the "After Visit Summary" that nursing will review with you.  You may have some home medications which will be placed on hold until you complete the course of blood thinner medication.  It is important for you to complete the blood thinner medication as prescribed.  PRECAUTIONS:  If you  experience chest pain or shortness of breath - call 911 immediately for transfer to the hospital emergency department.   If you develop a fever greater that  101 F, purulent drainage from wound, increased redness or drainage from wound, foul odor from the wound/dressing, or calf pain - CONTACT YOUR SURGEON.                                                   FOLLOW-UP APPOINTMENTS:  If you do not already have a post-op appointment, please call the office for an appointment to be seen by your surgeon.  Guidelines for how soon to be seen are listed in your "After Visit Summary", but are typically between 1-4 weeks after surgery.  OTHER INSTRUCTIONS:   Knee Replacement:  Do not place pillow under knee, focus on keeping the knee straight while resting. CPM instructions: 0-90 degrees, 2 hours in the morning, 2 hours in the afternoon, and 2 hours in the evening. Place foam block, curve side up under heel at all times except when in CPM or when walking.  DO NOT modify, tear, cut, or change the foam block in any way.   MAKE SURE YOU:  Understand these instructions.  Get help right away if you are not doing well or get worse.    Thank you for letting us be a part of your medical care team.  It is a privilege we respect greatly.  We hope these instructions will help you stay on track for a fast and full recovery!   Diagnostic Studies: DG Knee Right Port Result Date: 01/18/2024 CLINICAL DATA:  Status post revision knee arthroplasty. EXAM: PORTABLE RIGHT KNEE - 1-2 VIEW COMPARISON:  None Available. FINDINGS: Revision right knee arthroplasty in expected alignment. No periprosthetic lucency or fracture. There has been patellar resurfacing. Recent postsurgical change includes air and edema in the soft tissues and joint space. Anterior skin staples in place. IMPRESSION: Revision right knee arthroplasty without immediate postoperative complication. Electronically Signed   By: Narda Rutherford M.D.   On: 01/18/2024 21:10    Disposition: Discharge disposition: 01-Home or Self Care       Discharge Instructions     Call MD / Call 911   Complete by: As directed     If you experience chest pain or shortness of breath, CALL 911 and be transported to the hospital emergency room.  If you develope a fever above 101 F, pus (white drainage) or increased drainage or redness at the wound, or calf pain, call your surgeon's office.   Change dressing   Complete by: As directed    Do not remove your dressing.   Constipation Prevention   Complete by: As directed    Drink plenty of fluids.  Prune juice may be helpful.  You may use a stool softener, such as Colace (over the counter) 100 mg twice a day.  Use MiraLax (over the counter) for constipation as needed.   Diet - low sodium heart healthy   Complete by: As directed    Discharge instructions   Complete by: As directed    Elevate toes above nose. Use cryotherapy as needed for pain and swelling.   Do not put a pillow under the knee. Place it under the heel.   Complete by: As directed    Driving restrictions   Complete by:  As directed    No driving for 6 weeks   Increase activity slowly as tolerated   Complete by: As directed    Lifting restrictions   Complete by: As directed    No lifting for 6 weeks   Post-operative opioid taper instructions:   Complete by: As directed    POST-OPERATIVE OPIOID TAPER INSTRUCTIONS: It is important to wean off of your opioid medication as soon as possible. If you do not need pain medication after your surgery it is ok to stop day one. Opioids include: Codeine, Hydrocodone(Norco, Vicodin), Oxycodone(Percocet, oxycontin) and hydromorphone amongst others.  Long term and even short term use of opiods can cause: Increased pain response Dependence Constipation Depression Respiratory depression And more.  Withdrawal symptoms can include Flu like symptoms Nausea, vomiting And more Techniques to manage these symptoms Hydrate well Eat regular healthy meals Stay active Use relaxation techniques(deep breathing, meditating, yoga) Do Not substitute Alcohol to help with  tapering If you have been on opioids for less than two weeks and do not have pain than it is ok to stop all together.  Plan to wean off of opioids This plan should start within one week post op of your joint replacement. Maintain the same interval or time between taking each dose and first decrease the dose.  Cut the total daily intake of opioids by one tablet each day Next start to increase the time between doses. The last dose that should be eliminated is the evening dose.      TED hose   Complete by: As directed    Use stockings (TED hose) for 2 weeks on both leg(s).  You may remove them at night for sleeping.   Weight bearing as tolerated   Complete by: As directed         Follow-up Information     Clois Dupes, PA-C. Schedule an appointment as soon as possible for a visit in 2 week(s).   Specialty: Orthopedic Surgery Why: For suture removal, For wound re-check Contact information: [address], [address] [place] [address] [phone]                  Signed: Clois Dupes 01/20/2024, 3:45 PM

## 2024-01-20 NOTE — Progress Notes (Signed)
 Physical Therapy Treatment Patient Details Name: Javier Lawson MRN: [id number] DOB: [birth year] Today's Date: 01/20/2024   History of Present Illness Pt s/p R TKR revision and with hx of R TKR ~2021    PT Comments  Pt very cooperative and with decreasing level of assist noted with most tasks but activity limited by c/o pain and dizziness with attempts to ambulate.  Pt ambulated 28' before returning to sitting with BP 96/59 - BP supine 132/66.  RN aware.   If plan is discharge home, recommend the following: A little help with walking and/or transfers;A little help with bathing/dressing/bathroom;Assistance with cooking/housework;Assist for transportation;Help with stairs or ramp for entrance   Can travel by private vehicle        Equipment Recommendations       Recommendations for Other Services       Precautions / Restrictions Precautions Precautions: Knee;Fall Restrictions Weight Bearing Restrictions Per Provider Order: No Other Position/Activity Restrictions: WBAT     Mobility  Bed Mobility Overal bed mobility: Needs Assistance Bed Mobility: Supine to Sit     Supine to sit: Supervision     General bed mobility comments: INcreased time and use of belt to self assist    Transfers Overall transfer level: Needs assistance Equipment used: Rolling walker (2 wheels) Transfers: Sit to/from Stand Sit to Stand: Contact guard assist           General transfer comment: cues for LE management and use of UEs to self assist    Ambulation/Gait Ambulation/Gait assistance: Contact guard assist Gait Distance (Feet): 28 Feet Assistive device: Rolling walker (2 wheels) Gait Pattern/deviations: Step-to pattern, Decreased step length - right, Decreased step length - left, Shuffle, Antalgic, Trunk flexed Gait velocity: decr     General Gait Details: min cues for sequence, posture and position from RW; distance ltd by pain and c/o lightheadeness - BP 96/59   Stairs              Wheelchair Mobility     Tilt Bed    Modified Rankin (Stroke Patients Only)       Balance Overall balance assessment: Needs assistance Sitting-balance support: No upper extremity supported, Feet supported Sitting balance-Leahy Scale: Good     Standing balance support: No upper extremity supported Standing balance-Leahy Scale: Fair                              Hotel manager: No apparent difficulties  Cognition Arousal: Alert Behavior During Therapy: WFL for tasks assessed/performed   PT - Cognitive impairments: No apparent impairments                         Following commands: Intact      Cueing Cueing Techniques: Verbal cues  Exercises Total Joint Exercises Ankle Circles/Pumps: AROM, Both, 15 reps, Supine Quad Sets: AROM, Both, 10 reps, Supine Heel Slides: AAROM, Right, Supine, 15 reps Straight Leg Raises: AAROM, Right, Supine, 20 reps    General Comments        Pertinent Vitals/Pain Pain Assessment Pain Assessment: 0-10 Pain Score: 7  Pain Location: R knee Pain Descriptors / Indicators: Aching, Sore Pain Intervention(s): Limited activity within patient's tolerance, Monitored during session, Premedicated before session, Ice applied    Home Living                          Prior Function  PT Goals (current goals can now be found in the care plan section) Acute Rehab PT Goals Patient Stated Goal: REgain IND PT Goal Formulation: With patient Time For Goal Achievement: 01/26/24 Potential to Achieve Goals: Good Progress towards PT goals: Progressing toward goals    Frequency    7X/week      PT Plan      Co-evaluation              AM-PAC PT "6 Clicks" Mobility   Outcome Measure  Help needed turning from your back to your side while in a flat bed without using bedrails?: A Little Help needed moving from lying on your back to sitting on the side of a flat  bed without using bedrails?: A Little Help needed moving to and from a bed to a chair (including a wheelchair)?: A Little Help needed standing up from a chair using your arms (e.g., wheelchair or bedside chair)?: A Little Help needed to walk in hospital room?: A Little Help needed climbing 3-5 steps with a railing? : A Lot 6 Click Score: 17    End of Session Equipment Utilized During Treatment: Gait belt Activity Tolerance: Patient limited by pain;Other (comment) (orthostatic) Patient left: in chair;with call bell/phone within reach Nurse Communication: Mobility status PT Visit Diagnosis: Unsteadiness on feet (R26.81);Difficulty in walking, not elsewhere classified (R26.2)     Time: 6045-4098 PT Time Calculation (min) (ACUTE ONLY): 37 min  Charges:    $Gait Training: 8-22 mins $Therapeutic Exercise: 8-22 mins PT General Charges $$ ACUTE PT VISIT: 1 Visit                     Javier Lawson PT Acute Rehabilitation Services Pager [phone] Office [phone]    Javier Lawson 01/20/2024, 11:37 AM

## 2024-01-20 NOTE — Plan of Care (Signed)

## 2024-01-20 NOTE — Progress Notes (Signed)
 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  Pt denies any dizziness, reports improved pain control, and feeling much more comfortable with dc home this date.    If plan is discharge home, recommend the following: A little help with walking and/or transfers;A little help with bathing/dressing/bathroom;Assistance with cooking/housework;Assist for transportation;Help with stairs or ramp for entrance   Can travel by private vehicle        Equipment Recommendations       Recommendations for Other Services       Precautions / Restrictions Precautions Precautions: Knee;Fall Restrictions Weight Bearing Restrictions Per Provider Order: No Other Position/Activity Restrictions: WBAT     Mobility  Bed Mobility Overal bed mobility: Needs Assistance Bed Mobility: Sit to Supine     Supine to sit: Supervision Sit to supine: Supervision   General bed mobility comments: INcreased time and use of belt to self assist    Transfers Overall transfer level: Needs assistance Equipment used: Rolling walker (2 wheels) Transfers: Sit to/from Stand Sit to Stand: Contact guard assist           General transfer comment: cues for LE management and use of UEs to self assist    Ambulation/Gait Ambulation/Gait assistance: Contact guard assist Gait Distance (Feet): 100 Feet Assistive device: Rolling walker (2 wheels) Gait Pattern/deviations: Step-to pattern, Decreased step length - right, Decreased step length - left, Shuffle, Antalgic, Trunk flexed Gait velocity: decr     General Gait Details: min cues for sequence, posture and position from RW; no reports of  lightheadedness   Stairs Stairs: Yes Stairs assistance: Min assist Stair Management: One rail Right, Step to pattern, Forwards, With crutches Number of Stairs: 3 General stair comments: cues for sequence and foot/crutch placement   Wheelchair Mobility     Tilt Bed    Modified Rankin (Stroke Patients Only)       Balance Overall balance assessment: Needs assistance Sitting-balance support: No upper extremity supported, Feet supported Sitting balance-Leahy Scale: Good     Standing balance support: No upper extremity supported Standing balance-Leahy Scale: Fair                              Hotel manager: No apparent difficulties  Cognition Arousal: Alert Behavior During Therapy: WFL for tasks assessed/performed   PT - Cognitive impairments: No apparent impairments                         Following commands: Intact      Cueing Cueing Techniques: Verbal cues  Exercises Total Joint Exercises Ankle Circles/Pumps: AROM, Both, 15 reps, Supine Quad Sets: AROM, Both, 10 reps, Supine Heel Slides: AAROM, Right, Supine, 15 reps Straight Leg Raises: AAROM, Right, Supine, 20 reps    General Comments        Pertinent Vitals/Pain Pain Assessment Pain Assessment: 0-10 Pain Score: 5  Pain Location: R knee Pain Descriptors / Indicators: Aching, Sore Pain Intervention(s): Limited activity within patient's tolerance, Monitored during session, Premedicated before session, Ice applied    Home Living  Prior Function            PT Goals (current goals can now be found in the care plan section) Acute Rehab PT Goals Patient Stated Goal: REgain IND PT Goal Formulation: With patient Time For Goal Achievement: 01/26/24 Potential to Achieve Goals: Good Progress towards PT goals: Progressing toward goals    Frequency    7X/week      PT Plan      Co-evaluation               AM-PAC PT "6 Clicks" Mobility   Outcome Measure  Help needed turning from your back to your side while in a flat bed without using bedrails?: A Little Help needed moving from lying on your back to sitting on the side of a flat bed without using bedrails?: A Little Help needed moving to and from a bed to a chair (including a wheelchair)?: A Little Help needed standing up from a chair using your arms (e.g., wheelchair or bedside chair)?: A Little Help needed to walk in hospital room?: A Little Help needed climbing 3-5 steps with a railing? : A Little 6 Click Score: 18    End of Session Equipment Utilized During Treatment: Gait belt Activity Tolerance: Patient limited by pain;Other (comment) Patient left: with call bell/phone within reach;in bed Nurse Communication: Mobility status PT Visit Diagnosis: Unsteadiness on feet (R26.81);Difficulty in walking, not elsewhere classified (R26.2)     Time: 1610-9604 PT Time Calculation (min) (ACUTE ONLY): 18 min  Charges:    $Gait Training: 8-22 mins $Therapeutic Exercise: 8-22 mins PT General Charges $$ ACUTE PT VISIT: 1 Visit                     Javier Lawson PT Acute Rehabilitation Services Pager [phone] Office [phone]    Javier Lawson 01/20/2024, 2:32 PM

## 2024-01-20 NOTE — Progress Notes (Signed)
    Subjective:  Patient reports pain as moderate.  Denies N/V/CP/SOB/Abd pain. He denies any tingling or numbness in LE bilaterally. He reports that his pain is doing better on the oxycodone. He reports he did well with PT yesterday. He reports that he feels good about going home today.   Objective:   VITALS:   Vitals:   01/20/24 0222 01/20/24 0546 01/20/24 0746 01/20/24 0836  BP: 130/65 126/68 131/73 132/66  Pulse: 75 76 88 82  Resp: 17 18 18    Temp: 99.1 F (37.3 C) 98.7 F (37.1 C) 98.3 F (36.8 C) 99.3 F (37.4 C)  TempSrc: Oral Oral  Oral  SpO2: 97% 97% 95% 95%  Weight:      Height:        NAD Neurologically intact ABD soft Neurovascular intact Sensation intact distally Intact pulses distally Dorsiflexion/Plantar flexion intact Incision: dressing C/D/I No cellulitis present Compartment soft   Lab Results  Component Value Date   WBC 7.4 01/20/2024   HGB 10.6 (L) 01/20/2024   HCT 31.0 (L) 01/20/2024   MCV 87.3 01/20/2024   PLT 190 01/20/2024   BMET    Component Value Date/Time   NA 134 (L) 01/20/2024 0319   K 4.0 01/20/2024 0319   CL 103 01/20/2024 0319   CO2 24 01/20/2024 0319   GLUCOSE 115 (H) 01/20/2024 0319   BUN 18 01/20/2024 0319   CREATININE 1.05 01/20/2024 0319   CALCIUM 8.4 (L) 01/20/2024 0319   GFRNONAA >60 01/20/2024 0319     Assessment/Plan: 2 Days Post-Op   Principal Problem:   Failed total knee, right, initial encounter (HCC) Active Problems:   S/P revision of total knee, right  ABLA. Hemoglobin 10.6. Continue to monitor.   WBAT with walker DVT ppx: Aspirin, SCDs, TEDS PO pain control PT/OT: He ambulated 34 and 70 feet yesterday. Continue PT today.  Dispo:  - D/c home with OPPT once cleared with PT.    Clois Dupes 01/20/2024, 9:50 AM   [facility]  [place] [address], [address], [place], [place] [address] Phone: [phone] [email or web address] Facebook  [employer]

## 2024-01-23 LAB — AEROBIC/ANAEROBIC CULTURE W GRAM STAIN (SURGICAL/DEEP WOUND): Culture: NO GROWTH

## 2024-01-26 ENCOUNTER — Encounter (HOSPITAL_COMMUNITY): Payer: Self-pay | Admitting: Orthopedic Surgery
